# Patient Record
Sex: Female | Born: 1955 | Race: White | Hispanic: No | Smoking: Never smoker
Health system: Southern US, Community
[De-identification: ages and names within clinical notes are randomized; demographics above are authoritative.]

## PROBLEM LIST (undated history)

## (undated) DIAGNOSIS — K08409 Partial loss of teeth, unspecified cause, unspecified class: Secondary | ICD-10-CM

## (undated) DIAGNOSIS — N200 Calculus of kidney: Secondary | ICD-10-CM

## (undated) DIAGNOSIS — I10 Essential (primary) hypertension: Secondary | ICD-10-CM

## (undated) DIAGNOSIS — E785 Hyperlipidemia, unspecified: Secondary | ICD-10-CM

## (undated) DIAGNOSIS — H3090 Unspecified chorioretinal inflammation, unspecified eye: Secondary | ICD-10-CM

## (undated) DIAGNOSIS — F319 Bipolar disorder, unspecified: Secondary | ICD-10-CM

## (undated) DIAGNOSIS — E039 Hypothyroidism, unspecified: Secondary | ICD-10-CM

## (undated) DIAGNOSIS — I499 Cardiac arrhythmia, unspecified: Secondary | ICD-10-CM

## (undated) HISTORY — DX: Essential (primary) hypertension: I10

## (undated) HISTORY — DX: Hyperlipidemia, unspecified: E78.5

## (undated) HISTORY — DX: Unspecified chorioretinal inflammation, unspecified eye: H30.90

## (undated) HISTORY — PX: OTHER SURGICAL HISTORY: SHX169

---

## 1999-09-09 ENCOUNTER — Encounter: Admission: RE | Admit: 1999-09-09 | Discharge: 1999-09-09 | Payer: Self-pay | Admitting: Internal Medicine

## 1999-09-09 ENCOUNTER — Encounter (HOSPITAL_BASED_OUTPATIENT_CLINIC_OR_DEPARTMENT_OTHER): Payer: Self-pay | Admitting: Internal Medicine

## 2000-09-26 ENCOUNTER — Encounter: Payer: Self-pay | Admitting: Obstetrics and Gynecology

## 2000-09-26 ENCOUNTER — Encounter: Admission: RE | Admit: 2000-09-26 | Discharge: 2000-09-26 | Payer: Self-pay | Admitting: Obstetrics and Gynecology

## 2001-10-16 ENCOUNTER — Other Ambulatory Visit: Admission: RE | Admit: 2001-10-16 | Discharge: 2001-10-16 | Payer: Self-pay | Admitting: *Deleted

## 2001-12-13 ENCOUNTER — Encounter: Payer: Self-pay | Admitting: Obstetrics and Gynecology

## 2001-12-13 ENCOUNTER — Encounter: Admission: RE | Admit: 2001-12-13 | Discharge: 2001-12-13 | Payer: Self-pay | Admitting: Obstetrics and Gynecology

## 2002-10-05 ENCOUNTER — Encounter: Payer: Self-pay | Admitting: Internal Medicine

## 2002-10-05 ENCOUNTER — Encounter: Admission: RE | Admit: 2002-10-05 | Discharge: 2002-10-05 | Payer: Self-pay | Admitting: Internal Medicine

## 2002-10-08 ENCOUNTER — Encounter: Admission: RE | Admit: 2002-10-08 | Discharge: 2002-10-08 | Payer: Self-pay | Admitting: Internal Medicine

## 2002-10-08 ENCOUNTER — Encounter: Payer: Self-pay | Admitting: Internal Medicine

## 2002-10-22 ENCOUNTER — Other Ambulatory Visit: Admission: RE | Admit: 2002-10-22 | Discharge: 2002-10-22 | Payer: Self-pay | Admitting: *Deleted

## 2003-04-09 ENCOUNTER — Encounter: Admission: RE | Admit: 2003-04-09 | Discharge: 2003-04-09 | Payer: Self-pay | Admitting: *Deleted

## 2004-01-07 ENCOUNTER — Other Ambulatory Visit: Admission: RE | Admit: 2004-01-07 | Discharge: 2004-01-07 | Payer: Self-pay | Admitting: *Deleted

## 2004-05-08 ENCOUNTER — Emergency Department (HOSPITAL_COMMUNITY): Admission: EM | Admit: 2004-05-08 | Discharge: 2004-05-09 | Payer: Self-pay | Admitting: Emergency Medicine

## 2004-05-13 ENCOUNTER — Encounter: Admission: RE | Admit: 2004-05-13 | Discharge: 2004-05-13 | Payer: Self-pay | Admitting: *Deleted

## 2004-09-26 ENCOUNTER — Emergency Department (HOSPITAL_COMMUNITY): Admission: EM | Admit: 2004-09-26 | Discharge: 2004-09-26 | Payer: Self-pay | Admitting: Emergency Medicine

## 2005-01-14 ENCOUNTER — Emergency Department (HOSPITAL_COMMUNITY): Admission: EM | Admit: 2005-01-14 | Discharge: 2005-01-14 | Payer: Self-pay | Admitting: Emergency Medicine

## 2005-04-29 ENCOUNTER — Encounter: Admission: RE | Admit: 2005-04-29 | Discharge: 2005-04-29 | Payer: Self-pay | Admitting: Internal Medicine

## 2005-06-23 ENCOUNTER — Encounter: Admission: RE | Admit: 2005-06-23 | Discharge: 2005-06-23 | Payer: Self-pay | Admitting: *Deleted

## 2005-07-11 ENCOUNTER — Encounter: Admission: RE | Admit: 2005-07-11 | Discharge: 2005-07-11 | Payer: Self-pay | Admitting: *Deleted

## 2005-10-10 ENCOUNTER — Encounter: Admission: RE | Admit: 2005-10-10 | Discharge: 2005-10-10 | Payer: Self-pay | Admitting: Gastroenterology

## 2006-10-17 ENCOUNTER — Encounter: Admission: RE | Admit: 2006-10-17 | Discharge: 2006-10-17 | Payer: Self-pay | Admitting: *Deleted

## 2007-10-30 ENCOUNTER — Encounter: Admission: RE | Admit: 2007-10-30 | Discharge: 2007-10-30 | Payer: Self-pay | Admitting: Family Medicine

## 2008-10-30 ENCOUNTER — Encounter: Admission: RE | Admit: 2008-10-30 | Discharge: 2008-10-30 | Payer: Self-pay | Admitting: Internal Medicine

## 2009-11-02 ENCOUNTER — Encounter: Admission: RE | Admit: 2009-11-02 | Discharge: 2009-11-02 | Payer: Self-pay | Admitting: Internal Medicine

## 2010-06-22 ENCOUNTER — Encounter: Admission: RE | Admit: 2010-06-22 | Discharge: 2010-06-22 | Payer: Self-pay | Admitting: Internal Medicine

## 2010-11-22 ENCOUNTER — Other Ambulatory Visit: Payer: Self-pay | Admitting: Internal Medicine

## 2010-11-22 DIAGNOSIS — Z1231 Encounter for screening mammogram for malignant neoplasm of breast: Secondary | ICD-10-CM

## 2010-12-14 ENCOUNTER — Ambulatory Visit: Payer: Self-pay

## 2010-12-20 ENCOUNTER — Ambulatory Visit
Admission: RE | Admit: 2010-12-20 | Discharge: 2010-12-20 | Disposition: A | Discharge status: Home / Self Care | Payer: BC Managed Care – PPO | Source: Ambulatory Visit | Attending: Internal Medicine | Admitting: Internal Medicine

## 2010-12-20 DIAGNOSIS — Z1231 Encounter for screening mammogram for malignant neoplasm of breast: Secondary | ICD-10-CM

## 2011-12-30 ENCOUNTER — Other Ambulatory Visit: Payer: Self-pay | Admitting: Internal Medicine

## 2011-12-30 DIAGNOSIS — Z1231 Encounter for screening mammogram for malignant neoplasm of breast: Secondary | ICD-10-CM

## 2012-02-17 ENCOUNTER — Ambulatory Visit: Payer: BC Managed Care – PPO

## 2012-02-23 ENCOUNTER — Other Ambulatory Visit: Payer: Self-pay | Admitting: Obstetrics and Gynecology

## 2012-02-23 ENCOUNTER — Ambulatory Visit: Payer: BC Managed Care – PPO

## 2012-02-23 DIAGNOSIS — M8580 Other specified disorders of bone density and structure, unspecified site: Secondary | ICD-10-CM

## 2012-03-07 ENCOUNTER — Ambulatory Visit
Admission: RE | Admit: 2012-03-07 | Discharge: 2012-03-07 | Disposition: A | Discharge status: Home / Self Care | Payer: BC Managed Care – PPO | Source: Ambulatory Visit | Attending: Obstetrics and Gynecology | Admitting: Obstetrics and Gynecology

## 2012-03-07 ENCOUNTER — Ambulatory Visit
Admission: RE | Admit: 2012-03-07 | Discharge: 2012-03-07 | Disposition: A | Discharge status: Home / Self Care | Payer: BC Managed Care – PPO | Source: Ambulatory Visit | Attending: Internal Medicine | Admitting: Internal Medicine

## 2012-03-07 DIAGNOSIS — M8580 Other specified disorders of bone density and structure, unspecified site: Secondary | ICD-10-CM

## 2012-03-07 DIAGNOSIS — Z1231 Encounter for screening mammogram for malignant neoplasm of breast: Secondary | ICD-10-CM

## 2012-04-11 ENCOUNTER — Ambulatory Visit
Admission: RE | Admit: 2012-04-11 | Discharge: 2012-04-11 | Disposition: A | Discharge status: Home / Self Care | Payer: BC Managed Care – PPO | Source: Ambulatory Visit | Attending: Internal Medicine | Admitting: Internal Medicine

## 2012-04-11 ENCOUNTER — Other Ambulatory Visit: Payer: Self-pay | Admitting: Internal Medicine

## 2012-04-11 DIAGNOSIS — R109 Unspecified abdominal pain: Secondary | ICD-10-CM

## 2012-05-18 ENCOUNTER — Other Ambulatory Visit: Payer: Self-pay | Admitting: Urology

## 2012-05-18 ENCOUNTER — Encounter (HOSPITAL_COMMUNITY): Payer: Self-pay | Admitting: Anesthesiology

## 2012-05-18 ENCOUNTER — Ambulatory Visit (HOSPITAL_COMMUNITY)
Admission: RE | Admit: 2012-05-18 | Discharge: 2012-05-18 | Disposition: A | Discharge status: Home / Self Care | Payer: BC Managed Care – PPO | Source: Ambulatory Visit | Attending: Urology | Admitting: Urology

## 2012-05-18 ENCOUNTER — Ambulatory Visit (HOSPITAL_COMMUNITY): Payer: BC Managed Care – PPO | Admitting: Anesthesiology

## 2012-05-18 ENCOUNTER — Encounter (HOSPITAL_COMMUNITY): Payer: Self-pay | Admitting: *Deleted

## 2012-05-18 ENCOUNTER — Encounter (HOSPITAL_COMMUNITY)
Admission: RE | Disposition: A | Discharge status: Home / Self Care | Payer: Self-pay | Source: Ambulatory Visit | Attending: Urology

## 2012-05-18 DIAGNOSIS — E7800 Pure hypercholesterolemia, unspecified: Secondary | ICD-10-CM | POA: Insufficient documentation

## 2012-05-18 DIAGNOSIS — E039 Hypothyroidism, unspecified: Secondary | ICD-10-CM | POA: Insufficient documentation

## 2012-05-18 DIAGNOSIS — N201 Calculus of ureter: Secondary | ICD-10-CM | POA: Insufficient documentation

## 2012-05-18 DIAGNOSIS — Z79899 Other long term (current) drug therapy: Secondary | ICD-10-CM | POA: Insufficient documentation

## 2012-05-18 DIAGNOSIS — N1330 Unspecified hydronephrosis: Secondary | ICD-10-CM | POA: Insufficient documentation

## 2012-05-18 DIAGNOSIS — M899 Disorder of bone, unspecified: Secondary | ICD-10-CM | POA: Insufficient documentation

## 2012-05-18 HISTORY — DX: Partial loss of teeth, unspecified cause, unspecified class: K08.409

## 2012-05-18 HISTORY — DX: Calculus of kidney: N20.0

## 2012-05-18 HISTORY — DX: Hypothyroidism, unspecified: E03.9

## 2012-05-18 HISTORY — PX: CYSTOSCOPY/RETROGRADE/URETEROSCOPY/STONE EXTRACTION WITH BASKET: SHX5317

## 2012-05-18 HISTORY — DX: Bipolar disorder, unspecified: F31.9

## 2012-05-18 HISTORY — PX: CYSTOSCOPY W/ URETERAL STENT PLACEMENT: SHX1429

## 2012-05-18 HISTORY — DX: Cardiac arrhythmia, unspecified: I49.9

## 2012-05-18 SURGERY — CYSTOSCOPY, WITH RETROGRADE PYELOGRAM AND URETERAL STENT INSERTION
Anesthesia: General | Site: Ureter | Laterality: Left | Wound class: Clean Contaminated

## 2012-05-18 MED ORDER — ACETAMINOPHEN 10 MG/ML IV SOLN
INTRAVENOUS | Status: AC
  Filled 2012-05-18: qty 100

## 2012-05-18 MED ORDER — LACTATED RINGERS IV SOLN
INTRAVENOUS | Status: DC | PRN
  Administered 2012-05-18: 18:00:00 via INTRAVENOUS

## 2012-05-18 MED ORDER — MUPIROCIN 2 % EX OINT: TOPICAL_OINTMENT | Freq: Two times a day (BID) | CUTANEOUS | Status: DC

## 2012-05-18 MED ORDER — CEFAZOLIN SODIUM-DEXTROSE 2-3 GM-% IV SOLR
INTRAVENOUS | Status: AC
  Filled 2012-05-18: qty 50

## 2012-05-18 MED ORDER — OXYCODONE HCL 5 MG/5ML PO SOLN
5.0000 mg | Freq: Once | ORAL | Status: DC | PRN
  Filled 2012-05-18: qty 5

## 2012-05-18 MED ORDER — BELLADONNA ALKALOIDS-OPIUM 16.2-60 MG RE SUPP
RECTAL | Status: AC
  Filled 2012-05-18: qty 1

## 2012-05-18 MED ORDER — CEFAZOLIN SODIUM 1-5 GM-% IV SOLN
1.0000 g | INTRAVENOUS | Status: AC
  Administered 2012-05-18: 2 g via INTRAVENOUS

## 2012-05-18 MED ORDER — MIDAZOLAM HCL 5 MG/5ML IJ SOLN
INTRAMUSCULAR | Status: DC | PRN
  Administered 2012-05-18: 2 mg via INTRAVENOUS

## 2012-05-18 MED ORDER — INDIGOTINDISULFONATE SODIUM 8 MG/ML IJ SOLN
INTRAMUSCULAR | Status: AC
  Filled 2012-05-18: qty 5

## 2012-05-18 MED ORDER — PROPOFOL 10 MG/ML IV BOLUS
INTRAVENOUS | Status: DC | PRN
  Administered 2012-05-18: 200 mg via INTRAVENOUS

## 2012-05-18 MED ORDER — LIDOCAINE HCL (CARDIAC) 20 MG/ML IV SOLN
INTRAVENOUS | Status: DC | PRN
  Administered 2012-05-18: 50 mg via INTRAVENOUS

## 2012-05-18 MED ORDER — HYDROMORPHONE HCL PF 1 MG/ML IJ SOLN: 0.2500 mg | INTRAMUSCULAR | Status: DC | PRN

## 2012-05-18 MED ORDER — PROMETHAZINE HCL 25 MG/ML IJ SOLN: 6.2500 mg | INTRAMUSCULAR | Status: DC | PRN

## 2012-05-18 MED ORDER — ONDANSETRON HCL 4 MG/2ML IJ SOLN
INTRAMUSCULAR | Status: DC | PRN
  Administered 2012-05-18: 4 mg via INTRAVENOUS

## 2012-05-18 MED ORDER — MUPIROCIN 2 % EX OINT
TOPICAL_OINTMENT | CUTANEOUS | Status: AC
  Filled 2012-05-18: qty 22

## 2012-05-18 MED ORDER — ACETAMINOPHEN 10 MG/ML IV SOLN
INTRAVENOUS | Status: DC | PRN
  Administered 2012-05-18: 1000 mg via INTRAVENOUS

## 2012-05-18 MED ORDER — FENTANYL CITRATE 0.05 MG/ML IJ SOLN
INTRAMUSCULAR | Status: DC | PRN
  Administered 2012-05-18 (×2): 50 ug via INTRAVENOUS

## 2012-05-18 MED ORDER — IOHEXOL 300 MG/ML  SOLN
INTRAMUSCULAR | Status: DC | PRN
  Administered 2012-05-18: 2 mL via INTRAVENOUS

## 2012-05-18 MED ORDER — IOHEXOL 300 MG/ML  SOLN
INTRAMUSCULAR | Status: AC
  Filled 2012-05-18: qty 1

## 2012-05-18 MED ORDER — MEPERIDINE HCL 50 MG/ML IJ SOLN: 6.2500 mg | INTRAMUSCULAR | Status: DC | PRN

## 2012-05-18 MED ORDER — OXYCODONE HCL 5 MG PO TABS: 5.0000 mg | ORAL_TABLET | Freq: Once | ORAL | Status: DC | PRN

## 2012-05-18 MED ORDER — LACTATED RINGERS IV SOLN
INTRAVENOUS | Status: DC
  Administered 2012-05-18: 19:00:00 via INTRAVENOUS

## 2012-05-18 MED ORDER — LIDOCAINE HCL 2 % EX GEL
CUTANEOUS | Status: AC
  Filled 2012-05-18: qty 10

## 2012-05-18 SURGICAL SUPPLY — 22 items
ADAPTER CATH URET PLST 4-6FR (CATHETERS) ×3 IMPLANT
ADPR CATH URET STRL DISP 4-6FR (CATHETERS) ×2
BAG URO CATCHER STRL LF (DRAPE) ×3 IMPLANT
BASKET STONE NITINOL 3FR/115 (UROLOGICAL SUPPLIES) ×2 IMPLANT
CATH INTERMIT  6FR 70CM (CATHETERS) IMPLANT
CATH URET 5FR 28IN CONE TIP (BALLOONS) ×1
CATH URET 5FR 28IN OPEN ENDED (CATHETERS) ×3 IMPLANT
CATH URET 5FR 70CM CONE TIP (BALLOONS) ×1 IMPLANT
CLOTH BEACON ORANGE TIMEOUT ST (SAFETY) ×3 IMPLANT
DRAPE CAMERA CLOSED 9X96 (DRAPES) ×3 IMPLANT
GLOVE BIOGEL M 7.0 STRL (GLOVE) ×3 IMPLANT
GLOVE SURG SS PI 8.0 STRL IVOR (GLOVE) ×3 IMPLANT
GOWN PREVENTION PLUS XLARGE (GOWN DISPOSABLE) ×3 IMPLANT
GOWN STRL NON-REIN LRG LVL3 (GOWN DISPOSABLE) ×6 IMPLANT
GOWN STRL REIN XL XLG (GOWN DISPOSABLE) ×3 IMPLANT
MANIFOLD NEPTUNE II (INSTRUMENTS) ×3 IMPLANT
MARKER SKIN DUAL TIP RULER LAB (MISCELLANEOUS) ×3 IMPLANT
NS IRRIG 1000ML POUR BTL (IV SOLUTION) ×3 IMPLANT
PACK CYSTO (CUSTOM PROCEDURE TRAY) ×3 IMPLANT
STENT CONTOUR 6FRX24X.038 (STENTS) ×4 IMPLANT
TUBING CONNECTING 10 (TUBING) ×3 IMPLANT
WIRE COONS/BENSON .038X145CM (WIRE) ×3 IMPLANT

## 2012-05-18 NOTE — Anesthesia Preprocedure Evaluation (Addendum)
Anesthesia Evaluation  Patient identified by MRN, date of birth, ID band Patient awake    Reviewed: Allergy & Precautions, H&P , NPO status , Patient's Chart, lab work & pertinent test results  History of Anesthesia Complications (+) AWARENESS UNDER ANESTHESIA  Airway Mallampati: II TM Distance: >3 FB Neck ROM: Full    Dental  (+) Teeth Intact and Dental Advisory Given   Pulmonary neg pulmonary ROS,  breath sounds clear to auscultation        Cardiovascular Exercise Tolerance: Good - CAD negative cardio ROS  Rhythm:Regular Rate:Normal     Neuro/Psych PSYCHIATRIC DISORDERS Bipolar Disorder negative neurological ROS     GI/Hepatic negative GI ROS, Neg liver ROS,   Endo/Other  Hypothyroidism   Renal/GU Renal diseasenegative Renal ROS     Musculoskeletal negative musculoskeletal ROS (+)   Abdominal   Peds  Hematology negative hematology ROS (+)   Anesthesia Other Findings   Reproductive/Obstetrics                        Anesthesia Physical Anesthesia Plan  ASA: II and Emergent  Anesthesia Plan: General   Post-op Pain Management:    Induction: Intravenous  Airway Management Planned: LMA  Additional Equipment:   Intra-op Plan:   Post-operative Plan: Extubation in OR  Informed Consent: I have reviewed the patients History and Physical, chart, labs and discussed the procedure including the risks, benefits and alternatives for the proposed anesthesia with the patient or authorized representative who has indicated his/her understanding and acceptance.   Dental advisory given  Plan Discussed with: CRNA and Surgeon  Anesthesia Plan Comments:        Anesthesia Quick Evaluation

## 2012-05-18 NOTE — H&P (Signed)
Reason For Visit     Seen today as a work-in for complaint of acute on-set of abdominal pain w/N/V X 16+ hrs.   Active Problems Problems  1. Abdominal Pain 789.00 2. Hydronephrosis On The Left 591 3. Nausea With Vomiting 787.01 4. Ureteral Stone 592.1  History of Present Illness        56 YO female patient of Dr. Estil Daft seen today as a work-in for complaint of acute on-set of abdominal pain w/N/V X 16+hrs.  GU HX:  Last seen 05/04/12. Left ureteral stone. She developed acute onset of left flank pain. She was given what sounds like Toradol. This is her first stone. She has nausea and no emesis. No fever or chills. She has had no dysuria. She has not seen the stone pass.   Patient underwent CT scan of the abdomen and pelvis today and reviewed all the images. This showed a 3-4 mm left ureterovesical junction stone with mild proximal hydroureteronephrosis. There were no other stones.      She was seen by Dr. Brunilda Payor November 2012 at Dr Awilda Metro Lavoie's request for microhematuria x 2 at Dr Sharol Roussel office.  She denies frequency, urgency, gross hematuria or dysuria.  She has mild left lower quadrant discomfort on and off. NMP22 was normal. She had a negative hematuria work-up 20 years ago by Dr Aldean Ast.   KUB 04/11/12 -- there are multiple phleboliths. Compared the KUB with the CT from today. There are 4 phleboliths in the left pelvis in 5 calcifications on the KUB. One of these is likely the UVJ stone. The stone is small and should pass. There was a small phlebolith in the right pelvis. There were no other stones in the ureters. There were no stones around the kidneys. The bowel gas pattern was normal. The bones appeared normal.  Interval HX:  Denies passing stone material. Has not been able to keep pain controlled w/hydrocodone or N/V w/promethazine. Has had dry toast 6 hrs ago but has had vomiting since. No fever/chills.   Past Medical History Problems  1. History of  Anxiety (Symptom)  300.00 2. History of  Depression 311 3. History of  Hypercholesterolemia 272.0 4. History of  Hypothyroidism 244.9 5. History of  Menopause V49.81 6. History of  Osteopenia 733.90  Surgical History Problems  1. History of  Oral Surgery Tooth Extraction  Current Meds 1. Estradiol-Norethindrone Acet 1-0.5 MG Oral Tablet; Therapy: (Recorded:09Nov2012) to 2. Hydrocodone-Acetaminophen 5-325 MG Oral Tablet; TAKE 1 TO 2 TABLETS EVERY 4 TO 6  HOURS AS NEEDED FOR PAIN; Therapy: 07Aug2013 to (Evaluate:06Sep2013); Last  Rx:07Aug2013 3. LaMICtal XR 200 MG Oral Tablet Extended Release 24 Hour; Therapy: (Recorded:12Nov2012) to 4. Lipitor 40 MG Oral Tablet; Therapy: (Recorded:09Nov2012) to 5. Promethazine HCl 12.5 MG Oral Tablet; TAKE 1 TABLET EVERY 4 TO 6 HOURS AS NEEDED  FOR NAUSEA; Therapy: 07Aug2013 to (Evaluate:21Aug2013)  Requested for: 07Aug2013; Last  Rx:07Aug2013 6. SEROquel TABS; Therapy: (Recorded:09Nov2012) to 7. Synthroid 150 MCG Oral Tablet; Therapy: (Recorded:12Nov2012) to 8. Tamsulosin HCl 0.4 MG Oral Capsule; TAKE 1 CAPSULE Daily; Therapy: 07Aug2013 to  (Evaluate:06Sep2013)  Requested for: 07Aug2013; Last Rx:07Aug2013  Allergies Medication  1. Novocain SOLN  Family History Problems  1. Sororal history of  Blood In Urine 2. Maternal grandmother's history of  Diabetes Mellitus V18.0 3. Paternal history of  Family Health Status - Father's Age 24yrs 4. Maternal history of  Family Health Status - Mother's Age 26yrs 5. Maternal grandmother's history of  Osteoporosis V17.81 6. Sororal history  of  Osteoporosis V17.81 Denied  7. Family history of  Family Health Status Number Of Children  Social History Problems  1. Being A Social Drinker 2. Caffeine Use 2 qd 3. Marital History - Currently Married 4. Never A Smoker 5. Occupation: Advertising account executive Denied  6. History of  Alcohol Use  Review of Systems Genitourinary, constitutional, skin, eye, otolaryngeal,  hematologic/lymphatic, cardiovascular, pulmonary, endocrine, musculoskeletal, gastrointestinal, neurological and psychiatric system(s) were reviewed and pertinent findings if present are noted.  Gastrointestinal: nausea, vomiting and abdominal pain.    Vitals Vital Signs [Data Includes: Last 1 Day]  13Sep2013 01:48PM  BMI Calculated: 27.98 BSA Calculated: 1.85 Height: 5 ft 5.5 in Weight: 170 lb  Blood Pressure: 158 / 89 Temperature: 98.3 F Heart Rate: 94  Physical Exam Constitutional: Well nourished and well developed . In acute distress. The patient appears well hydrated.  ENT:. The ears and nose are normal in appearance.  Neck: The appearance of the neck is normal.  Pulmonary: No respiratory distress.  Cardiovascular: Heart rate and rhythm are normal.  Abdomen: The abdomen is flat. The abdomen is soft and nontender. Moderate tenderness in the LLQ is present. moderate left CVA tenderness.  Skin: Normal skin turgor and normal skin color and pigmentation.  Neuro/Psych:. Mood and affect are appropriate.    Results/Data Urine [Data Includes: Last 1 Day]   13Sep2013  COLOR YELLOW   APPEARANCE CLEAR   SPECIFIC GRAVITY >1.030   pH 5.5   GLUCOSE NEG mg/dL  BILIRUBIN NEG   KETONE NEG mg/dL  BLOOD LARGE   PROTEIN TRACE mg/dL  UROBILINOGEN 0.2 mg/dL  NITRITE NEG   LEUKOCYTE ESTERASE NEG   SQUAMOUS EPITHELIAL/HPF RARE   WBC 0-2 WBC/hpf  RBC 3-6 RBC/hpf  BACTERIA NONE SEEN   CRYSTALS NONE SEEN   CASTS NONE SEEN    The following images/tracing/specimen were independently visualized:  CTU: moderate/severe left hydonephrosis and hydroureter secondary to distal left 4 mm ueteral calculus.  The following clinical lab reports were reviewed:  UA. Selected Results  AU CT-STONE PROTOCOL 13Sep2013 12:00AM Jetta Lout   Test Name Result Flag Reference  ** RADIOLOGY REPORT BY [facility] **   *RADIOLOGY REPORT*  Clinical Data: The left flank and lower quadrant pain.  Micro hematuria. Urolithiasis.  CT ABDOMEN AND PELVIS WITHOUT CONTRAST (URINARY CALCULUS PROTOCOL)  Technique: Multidetector CT imaging was performed through the abdomen and pelvis without intravenous contrast to include the urinary tract.  Comparison: None.  Findings: Moderate left hydronephrosis and ureterectasis is seen. A 2 mm calculus is seen at the left ureterovesicle junction. There is mild left renal swelling and perinephric stranding. No intrarenal calculi are identified. No evidence of right-sided ureteral calculi or hydronephrosis. Pelvic phleboliths noted bilaterally.  The other abdominal parenchymal organs are normal in appearance. Gallbladder is unremarkable. No soft tissue masses or lymphadenopathy identified. Uterus and adnexa are unremarkable. No evidence of inflammatory process or abnormal fluid collections.  IMPRESSION:  2 mm distal left ureteral calculus at the ureterovesicle junction, causing moderate left hydronephrosis and perinephric stranding.   Original Report Authenticated By: Danae Orleans, M.D.   Assessment Assessed  1. Abdominal Pain 789.00 2. Nausea With Vomiting 787.01 3. Ureteral Stone 592.1 4. Hydronephrosis On The Left 591      Ketorolac 60 mg IM.  Morphine 8mg /Promethazine 25 mg IM  Will schedule pt for cystoureterscopy, L RPG, stone extraction, and possible double J stent placement today by Dr.Viktoriya Glaspy. Risks and benefits discussed with both pt and mother  which include: bleeding, infection, and possible ureteral injury. All questions addressed and answered to pt's satisfication and wishes to proceed.   Plan  Ureteral Stone (592.1)  1. Ketorolac Tromethamine 60 MG/2ML Injection Solution; INJECT 60  MG Intramuscular; Done:  13Sep2013 02:26PM; Status: COMPLETE 2. Morphine Sulfate 8 MG/ML Injection Solution; INJECT 8  MG Intramuscular; Done: 13Sep2013  02:29PM; Status: COMPLETE 3. Promethazine HCl 25 MG/ML Injection Solution; INJECT 25 MG  Intramuscular; Done: 13Sep2013  02:28PM; Status: COMPLETE    Scheduled cystoureterscopy, L RPG, stone extraction, and possible double J stent placement by Dr. Brunilda Payor  AU CT-STONE PROTOCOL  Status: Hold For - Appointment,Date of Service,PreCert,Print  Requested for: 13Sep2013 Ordered; For: Ureteral Stone (592.1); Ordered By: Jetta Lout  Due: 15Sep2013 Marked Important   Signatures Electronically signed by : Jetta Lout, Dyann Ruddle; May 18 2012  4:32PM

## 2012-05-18 NOTE — Anesthesia Postprocedure Evaluation (Signed)
Anesthesia Post Note  Patient: Miranda Rollins  Procedure(s) Performed: Procedure(s) (LRB): CYSTOSCOPY WITH RETROGRADE PYELOGRAM/URETERAL STENT PLACEMENT (Left) CYSTOSCOPY/RETROGRADE/URETEROSCOPY/STONE EXTRACTION WITH BASKET (Left)  Anesthesia type: General  Patient location: PACU  Post pain: Pain level controlled  Post assessment: Post-op Vital signs reviewed  Last Vitals: BP 135/79  Pulse 93  Temp 36.9 C (Oral)  Resp 19  Ht 5' 5.5" (1.664 m)  Wt 167 lb 2 oz (75.807 kg)  BMI 27.39 kg/m2  SpO2 100%  Post vital signs: Reviewed  Level of consciousness: sedated  Complications: No apparent anesthesia complications

## 2012-05-18 NOTE — Preoperative (Signed)
Beta Blockers   Reason not to administer Beta Blockers:Not Applicable 

## 2012-05-18 NOTE — Op Note (Signed)
Miranda Rollins is a 56 y.o.   05/18/2012  General  Pre-op diagnosis: Left distal ureteral calculus, left hydronephrosis  Postop diagnosis: Same  Procedure done: Cystoscopy, left retrograde pyelogram, ureteroscopy with stone extraction and insertion of double-J stent  Surgeon: Wendie Simmer. Keiaira Donlan  Anesthesia: General  Indication: Patient is a 56 years old female who has been having left flank pain on and off for the past 4-6 weeks. She is known to have a 4 mm distal ureteral calculus. She was treated with medical expulsive therapy. However she has not passed the stone. Early this morning she started having severe left flank pain associated with nausea and vomiting. A repeat CT scan showed the stone in the left  distal ureter with moderate proximal hydronephrosis. She was treated with analgesics  and was advised to have the stone removed. The procedure , risks , benefits were discussed with the patient. The risks include but are not limited to hemorrhage, infection, ureteral injury, inability to remove the stone. She understands and is agreeable.  Procedure: Patient was identified by her wrist band and proper timeout was taken.  Under general anesthesia she was prepped and draped and placed in the dorsolithotomy position. A panendoscope was inserted in the bladder. The bladder mucosa is normal. There is no stone or tumor in the bladder. The ureteral orifices are in normal position and shape.  Retrograde pyelogram:  A cone-tip catheter was passed through the cystoscope and the left ureteral orifice. 2 cc of Omnipaque were then injected through the cone-tip catheter. The distal ureter appears moderately dilated and there is a filling defect in the distal ureter consistent with the known stone. I did not inject anymore contrast in the ureter to evaluate the mid and proximal ureter. The cone-tip catheter was then removed.  A sensor wire was passed through the cystoscope and the left ureter. The cystoscope  was removed.  A semirigid ureteroscope was then passed in the bladder and through the left ureteral orifice. The stone had migrated in the mid ureter but I was able to pass the ureteroscope without difficulty up to the level of the stone. The stone was then grasped with a Nitinol basket and extracted. The ureteroscope was then reinserted in the ureter and there is no evidence of any other stone in the distal mid or proximal ureter. The ureteroscope was then removed. The sensor wire was then backloaded into the cystoscope and a #[id number] [id number] double-J stent was passed over the sensor wire. The proximal curl of the double-J stent is in the collecting system the distal curl is in the bladder. The string was left attached to the double-J stent for easy removal.  The bladder was then emptied and the cystoscope and guidewire removed.  The patient tolerated the procedure well and left the OR in satisfactory condition to postanesthesia care unit.

## 2012-05-18 NOTE — Transfer of Care (Signed)
Immediate Anesthesia Transfer of Care Note  Patient: Miranda Rollins  Procedure(s) Performed: Procedure(s) (LRB) with comments: CYSTOSCOPY WITH RETROGRADE PYELOGRAM/URETERAL STENT PLACEMENT (Left) CYSTOSCOPY/RETROGRADE/URETEROSCOPY/STONE EXTRACTION WITH BASKET (Left)  Patient Location: PACU  Anesthesia Type: General  Level of Consciousness: awake, alert , oriented and patient cooperative  Airway & Oxygen Therapy: Patient Spontanous Breathing and Patient connected to face mask oxygen  Post-op Assessment: Report given to PACU RN, Post -op Vital signs reviewed and stable and Patient moving all extremities  Post vital signs: Reviewed and stable  Complications: No apparent anesthesia complications

## 2012-05-21 ENCOUNTER — Encounter (HOSPITAL_COMMUNITY): Payer: Self-pay | Admitting: Urology

## 2013-02-04 ENCOUNTER — Other Ambulatory Visit: Payer: Self-pay

## 2013-02-04 DIAGNOSIS — Z1231 Encounter for screening mammogram for malignant neoplasm of breast: Secondary | ICD-10-CM

## 2013-04-03 ENCOUNTER — Ambulatory Visit
Admission: RE | Admit: 2013-04-03 | Discharge: 2013-04-03 | Disposition: A | Discharge status: Home / Self Care | Payer: BC Managed Care – PPO | Source: Ambulatory Visit

## 2013-04-03 DIAGNOSIS — Z1231 Encounter for screening mammogram for malignant neoplasm of breast: Secondary | ICD-10-CM

## 2014-01-15 ENCOUNTER — Emergency Department (HOSPITAL_COMMUNITY): Payer: 59

## 2014-01-15 ENCOUNTER — Emergency Department (HOSPITAL_COMMUNITY)
Admission: EM | Admit: 2014-01-15 | Discharge: 2014-01-15 | Disposition: A | Discharge status: Home / Self Care | Payer: 59 | Attending: Emergency Medicine | Admitting: Emergency Medicine

## 2014-01-15 ENCOUNTER — Encounter (HOSPITAL_COMMUNITY): Payer: Self-pay | Admitting: Emergency Medicine

## 2014-01-15 DIAGNOSIS — E039 Hypothyroidism, unspecified: Secondary | ICD-10-CM | POA: Insufficient documentation

## 2014-01-15 DIAGNOSIS — Z87442 Personal history of urinary calculi: Secondary | ICD-10-CM | POA: Insufficient documentation

## 2014-01-15 DIAGNOSIS — R42 Dizziness and giddiness: Secondary | ICD-10-CM | POA: Insufficient documentation

## 2014-01-15 DIAGNOSIS — Z8619 Personal history of other infectious and parasitic diseases: Secondary | ICD-10-CM | POA: Insufficient documentation

## 2014-01-15 DIAGNOSIS — F411 Generalized anxiety disorder: Secondary | ICD-10-CM | POA: Insufficient documentation

## 2014-01-15 DIAGNOSIS — F319 Bipolar disorder, unspecified: Secondary | ICD-10-CM | POA: Insufficient documentation

## 2014-01-15 DIAGNOSIS — Z79899 Other long term (current) drug therapy: Secondary | ICD-10-CM | POA: Insufficient documentation

## 2014-01-15 DIAGNOSIS — K08109 Complete loss of teeth, unspecified cause, unspecified class: Secondary | ICD-10-CM | POA: Insufficient documentation

## 2014-01-15 DIAGNOSIS — R9389 Abnormal findings on diagnostic imaging of other specified body structures: Secondary | ICD-10-CM

## 2014-01-15 DIAGNOSIS — Z7982 Long term (current) use of aspirin: Secondary | ICD-10-CM | POA: Insufficient documentation

## 2014-01-15 LAB — COMPREHENSIVE METABOLIC PANEL
ALBUMIN: 4.4 g/dL (ref 3.5–5.2)
ALT: 24 U/L (ref 0–35)
AST: 32 U/L (ref 0–37)
Alkaline Phosphatase: 92 U/L (ref 39–117)
BUN: 11 mg/dL (ref 6–23)
CALCIUM: 9.9 mg/dL (ref 8.4–10.5)
CO2: 24 meq/L (ref 19–32)
CREATININE: 0.72 mg/dL (ref 0.50–1.10)
Chloride: 99 mEq/L (ref 96–112)
GFR calc Af Amer: >90 mL/min (ref >90)
Glucose, Bld: 127 mg/dL — ABNORMAL HIGH (ref 70–99)
Potassium: 3.7 mEq/L (ref 3.7–5.3)
Sodium: 139 mEq/L (ref 137–147)
Total Bilirubin: 0.9 mg/dL (ref 0.3–1.2)
Total Protein: 8.1 g/dL (ref 6.0–8.3)

## 2014-01-15 LAB — CBC WITH DIFFERENTIAL/PLATELET
BASOS ABS: 0 10*3/uL (ref 0.0–0.1)
BASOS PCT: 0 % (ref 0–1)
EOS PCT: 1 % (ref 0–5)
Eosinophils Absolute: 0.1 10*3/uL (ref 0.0–0.7)
HEMATOCRIT: 37.3 % (ref 36.0–46.0)
Hemoglobin: 12.6 g/dL (ref 12.0–15.0)
Lymphocytes Relative: 28 % (ref 12–46)
Lymphs Abs: 2.2 10*3/uL (ref 0.7–4.0)
MCH: 31.2 pg (ref 26.0–34.0)
MCHC: 33.8 g/dL (ref 30.0–36.0)
MCV: 92.3 fL (ref 78.0–100.0)
MONO ABS: 0.5 10*3/uL (ref 0.1–1.0)
MONOS PCT: 6 % (ref 3–12)
Neutro Abs: 4.9 10*3/uL (ref 1.7–7.7)
Neutrophils Relative %: 65 % (ref 43–77)
Platelets: 234 10*3/uL (ref 150–400)
RBC: 4.04 MIL/uL (ref 3.87–5.11)
RDW: 12.9 % (ref 11.5–15.5)
WBC: 7.7 10*3/uL (ref 4.0–10.5)

## 2014-01-15 MED ORDER — MECLIZINE HCL 50 MG PO TABS: 50.0000 mg | ORAL_TABLET | Freq: Three times a day (TID) | ORAL | Status: DC | PRN

## 2014-01-15 NOTE — ED Notes (Signed)
Patient transported to Ultrasound 

## 2014-01-15 NOTE — ED Provider Notes (Signed)
Patient's MRI results reviewed with her. She will be given prescription for Antivert and referral to neurology  Toy BakerAnthony T Morrell Fluke, MD 01/15/14 1751

## 2014-01-15 NOTE — ED Notes (Addendum)
Pt reports 11:30pm was feeling fine; took cough syrup for cough starting yesterday and woke up with blurry vision. Pt felt dizzy as well so ate crackers and took shower. Laid down and felt better but MD said she should come here. Current symptoms: dry mouth, tingly, blurred vision, light sensitivity; no dizzness. Pt concerned cough syrup interacted with mental health meds.

## 2014-01-15 NOTE — ED Provider Notes (Signed)
CSN: [id number]     Arrival date & time 01/15/14  1056 History   First MD Initiated Contact with Patient 01/15/14 1122     Chief Complaint  Patient presents with  . Blurred Vision      HPI  She presents after an episode of dizziness and blurry vision this morning. She woke up, and sat up. She thought things were spinning around. She states her vision was blurry. She admits that she got very anxious. 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  Had a dilated exam there was told was normal. By the time she got to her ophthalmologist office her symptoms had resolved. She states it all together they lasted less than 10 or 15 minutes. She had nausea. She had a spinning sensation. She did not have headache. Her vision is normal she is currently asymptomatic. She was sent over from her retina specialist for evaluation for possible TIA/posterior circulation stroke.  She has a history of hypercholesterolemia. No other wrist or cardiovascular disease. No family history cardiovascular disease. Nonsmoker. She has hypertension that she control of exercise. Nondiabetic.  Past Medical History  Diagnosis Date  . Kidney stone   . Irregular heart beat   . Hypothyroidism   . Bipolar 1 disorder   . Wisdom teeth extracted    Past Surgical History  Procedure Laterality Date  . Cystoscopy w/ ureteral stent placement  05/18/2012    Procedure: CYSTOSCOPY WITH RETROGRADE PYELOGRAM/URETERAL STENT PLACEMENT;  Surgeon: Lindaann SloughMarc-Henry Nesi, MD;  Location: [facility];  Service: Urology;  Laterality: Left;  . Cystoscopy/retrograde/ureteroscopy/stone extraction with basket  05/18/2012    Procedure: CYSTOSCOPY/RETROGRADE/URETEROSCOPY/STONE EXTRACTION WITH BASKET;  Surgeon: Lindaann SloughMarc-Henry Nesi, MD;  Location: [facility];  Service: Urology;  Laterality: Left;   History reviewed. No pertinent family history. History  Substance Use Topics  . Smoking status: Never Smoker    . Smokeless tobacco: Not on file  . Alcohol Use: Yes   OB History   Grav Para Term Preterm Abortions TAB SAB Ect Mult Living                 Review of Systems  Constitutional: Negative for fever, chills, diaphoresis, appetite change and fatigue.  HENT: Negative for mouth sores, sore throat and trouble swallowing.   Eyes: Positive for visual disturbance.  Respiratory: Negative for cough, chest tightness, shortness of breath and wheezing.   Cardiovascular: Negative for chest pain.  Gastrointestinal: Negative for nausea, vomiting, abdominal pain, diarrhea and abdominal distention.  Endocrine: Negative for polydipsia, polyphagia and polyuria.  Genitourinary: Negative for dysuria, frequency and hematuria.  Musculoskeletal: Negative for gait problem.  Skin: Negative for color change, pallor and rash.  Neurological: Positive for dizziness. Negative for syncope, light-headedness and headaches.  Hematological: Does not bruise/bleed easily.  Psychiatric/Behavioral: Negative for behavioral problems and confusion.      Allergies  Novocain  Home Medications   Prior to Admission medications   Medication Sig Start Date End Date Taking? Authorizing Provider  aspirin EC 81 MG tablet Take 81 mg by mouth daily.   Yes Historical Provider, MD  atorvastatin (LIPITOR) 80 MG tablet Take 80 mg by mouth daily.   Yes Historical Provider, MD  estradiol (ESTRACE) 0.1 MG/GM vaginal cream Place 1 Applicatorful vaginally daily as needed (for dryness).   Yes Historical Provider, MD  Flaxseed, Linseed, (FLAX SEEDS PO) Take 1 tablet by mouth daily.   Yes Historical Provider, MD  IRON PO Take 1  tablet by mouth daily.   Yes Historical Provider, MD  lamoTRIgine (LAMICTAL) 200 MG tablet Take 200 mg by mouth daily.   Yes Historical Provider, MD  levothyroxine (SYNTHROID, LEVOTHROID) 150 MCG tablet Take 150 mcg by mouth daily.   Yes Historical Provider, MD  Omega-3 Fatty Acids (FISH OIL PO) Take 1 capsule by mouth  daily.   Yes Historical Provider, MD  QUEtiapine (SEROQUEL XR) 200 MG 24 hr tablet Take 200 mg by mouth at bedtime.   Yes Historical Provider, MD   BP 144/82  Pulse 96  Temp(Src) 99 F (37.2 C) (Oral)  Resp 18  Ht 5\' 5"  (1.651 m)  Wt 171 lb (77.565 kg)  BMI 28.46 kg/m2  SpO2 94% Physical Exam  Constitutional: She is oriented to person, place, and time. She appears well-developed and well-nourished. No distress.  HENT:  Head: Normocephalic.  Eyes: Conjunctivae are normal. Pupils are equal, round, and reactive to light. No scleral icterus.  Pupils dilated almost the full width of her iris. Consistent with a recent dilated exam her ophthalmologist. No retinal abnormalities noted.  Neck: Normal range of motion. Neck supple. No thyromegaly present.  Cardiovascular: Normal rate and regular rhythm.  Exam reveals no gallop and no friction rub.   No murmur heard. Pulmonary/Chest: Effort normal and breath sounds normal. No respiratory distress. She has no wheezes. She has no rales.  Abdominal: Soft. Bowel sounds are normal. She exhibits no distension. There is no tenderness. There is no rebound.  Musculoskeletal: Normal range of motion.  Neurological: She is alert and oriented to person, place, and time.  Cranial nerve exam without abnormalities. No nystagmus. No subjective diplopia or blurring of vision. No pronator drift.  Skin: Skin is warm and dry. No rash noted.  Psychiatric: She has a normal mood and affect. Her behavior is normal.    ED Course  Procedures (including critical care time) Labs Review Labs Reviewed  COMPREHENSIVE METABOLIC PANEL - Abnormal; Notable for the following:    Glucose, Bld 127 (*)    All other components within normal limits  CBC WITH DIFFERENTIAL    Imaging Review Mr Brain Wo Contrast  01/15/2014   CLINICAL DATA:  Episode of blurred vision with vertigo and nausea. Sensitivity to light.  EXAM: MRI HEAD WITHOUT CONTRAST  TECHNIQUE: Multiplanar, multiecho  pulse sequences of the brain and surrounding structures were obtained without intravenous contrast.  COMPARISON:  None.  FINDINGS: The diffusion-weighted images demonstrate no evidence for acute or subacute infarction. Scattered subcortical T2 hyperintensities are at the upper limits of normal for age.  Flow is present in the major intracranial arteries. The globes and orbits are intact. The paranasal sinuses and mastoid air cells are clear.  IMPRESSION: 1. Scattered subcortical T2 hyperintensities are at the upper limits of normal for age. The finding is nonspecific but can be seen in the setting of chronic microvascular ischemia, a demyelinating process such as multiple sclerosis, vasculitis, complicated migraine headaches, or as the sequelae of a prior infectious or inflammatory process. 2. No acute intracranial abnormality or focal lesion to explain the patient's symptoms.   Electronically Signed   By: Gennette Pac M.D.   On: 01/15/2014 17:22     EKG Interpretation None      MDM   Final diagnoses:  Vertigo    MRI ordered. I think her symptoms are most consistent with acute peripheral vertigo remains asymptomatic here. If her MRI is normal I think she is appropriate for outpatient treatment.  Rolland PorterMark Breckyn Ticas, MD 01/16/14 [phone]

## 2014-01-15 NOTE — Discharge Instructions (Signed)
Your MRI today had some abnormal findings that require you to be seen by a neurologist. Please call them tomorrow to schedule a follow up visit Benign Positional Vertigo Vertigo means you feel like you or your surroundings are moving when they are not. Benign positional vertigo is the most common form of vertigo. Benign means that the cause of your condition is not serious. Benign positional vertigo is more common in older adults. CAUSES  Benign positional vertigo is the result of an upset in the labyrinth system. This is an area in the middle ear that helps control your balance. This may be caused by a viral infection, head injury, or repetitive motion. However, often no specific cause is found. SYMPTOMS  Symptoms of benign positional vertigo occur when you move your head or eyes in different directions. Some of the symptoms may include:  Loss of balance and falls.  Vomiting.  Blurred vision.  Dizziness.  Nausea.  Involuntary eye movements (nystagmus). DIAGNOSIS  Benign positional vertigo is usually diagnosed by physical exam. If the specific cause of your benign positional vertigo is unknown, your caregiver may perform imaging tests, such as magnetic resonance imaging (MRI) or computed tomography (CT). TREATMENT  Your caregiver may recommend movements or procedures to correct the benign positional vertigo. Medicines such as meclizine, benzodiazepines, and medicines for nausea may be used to treat your symptoms. In rare cases, if your symptoms are caused by certain conditions that affect the inner ear, you may need surgery. HOME CARE INSTRUCTIONS   Follow your caregiver's instructions.  Move slowly. Do not make sudden body or head movements.  Avoid driving.  Avoid operating heavy machinery.  Avoid performing any tasks that would be dangerous to you or others during a vertigo episode.  Drink enough fluids to keep your urine clear or pale yellow. SEEK IMMEDIATE MEDICAL CARE IF:    You develop problems with walking, weakness, numbness, or using your arms, hands, or legs.  You have difficulty speaking.  You develop severe headaches.  Your nausea or vomiting continues or gets worse.  You develop visual changes.  Your family or friends notice any behavioral changes.  Your condition gets worse.  You have a fever.  You develop a stiff neck or sensitivity to light. MAKE SURE YOU:   Understand these instructions.  Will watch your condition.  Will get help right away if you are not doing well or get worse. Document Released: 05/30/2006 Document Revised: 11/14/2011 Document Reviewed: 05/12/2011 [facility] Patient Information 2014 [place], [place].

## 2014-01-15 NOTE — ED Notes (Signed)
All symptoms have completely resolved at this time.

## 2016-08-11 ENCOUNTER — Emergency Department (HOSPITAL_COMMUNITY): Payer: Self-pay

## 2016-08-11 ENCOUNTER — Emergency Department (HOSPITAL_COMMUNITY)
Admission: EM | Admit: 2016-08-11 | Discharge: 2016-08-11 | Disposition: A | Discharge status: Home / Self Care | Payer: Self-pay | Attending: Emergency Medicine | Admitting: Emergency Medicine

## 2016-08-11 DIAGNOSIS — W228XXA Striking against or struck by other objects, initial encounter: Secondary | ICD-10-CM | POA: Insufficient documentation

## 2016-08-11 DIAGNOSIS — Y929 Unspecified place or not applicable: Secondary | ICD-10-CM | POA: Insufficient documentation

## 2016-08-11 DIAGNOSIS — Y939 Activity, unspecified: Secondary | ICD-10-CM | POA: Insufficient documentation

## 2016-08-11 DIAGNOSIS — E039 Hypothyroidism, unspecified: Secondary | ICD-10-CM | POA: Insufficient documentation

## 2016-08-11 DIAGNOSIS — Z7982 Long term (current) use of aspirin: Secondary | ICD-10-CM | POA: Insufficient documentation

## 2016-08-11 DIAGNOSIS — S0990XA Unspecified injury of head, initial encounter: Secondary | ICD-10-CM

## 2016-08-11 DIAGNOSIS — S0001XA Abrasion of scalp, initial encounter: Secondary | ICD-10-CM | POA: Insufficient documentation

## 2016-08-11 DIAGNOSIS — Y999 Unspecified external cause status: Secondary | ICD-10-CM | POA: Insufficient documentation

## 2016-08-11 NOTE — ED Triage Notes (Signed)
Per EMS, pt. [place] reported of head injury after broken door fell backward and hit pt.'s  right frontal head. Denied LOC. No skin lac reported. Pain at 4/10. Incident happnedd at 0745 this evening .

## 2016-08-11 NOTE — ED Provider Notes (Signed)
[facility]-EMERGENCY DEPT Provider Note   CSN: [id number] Arrival date & time: 08/11/16  2006     History   Chief Complaint Chief Complaint  Patient presents with  . Head Injury    HPI Miranda Rollins is a 60 y.o. female.  The history is provided by the patient and medical records. No language interpreter was used.  Head Injury   Pertinent negatives include no numbness, no vomiting and no weakness.   Miranda Rollins is a 60 y.o. female  with a PMH of bipolar disorder and hypothyroidism who presents to the Emergency Department complaining of head injury which occurred just prior to arrival. Patient states that a door was taken off the hinges and placed along a wall. Unfortunately the door fell downward and hit her atop the head. Patient reports headache and right-sided neck pain but no other complaints. No n/v, slurred speech, extremity weakness. No LOC or change in mental status. No medications taken prior to arrival for symptoms. No alleviating or aggravating factors noted.    Past Medical History:  Diagnosis Date  . Bipolar 1 disorder   . Hypothyroidism   . Irregular heart beat   . Kidney stone   . Wisdom teeth extracted     There are no active problems to display for this patient.   Past Surgical History:  Procedure Laterality Date  . CYSTOSCOPY W/ URETERAL STENT PLACEMENT  05/18/2012   Procedure: CYSTOSCOPY WITH RETROGRADE PYELOGRAM/URETERAL STENT PLACEMENT;  Surgeon: Lindaann Slough, MD;  Location: [facility] ORS;  Service: Urology;  Laterality: Left;  . CYSTOSCOPY/RETROGRADE/URETEROSCOPY/STONE EXTRACTION WITH BASKET  05/18/2012   Procedure: CYSTOSCOPY/RETROGRADE/URETEROSCOPY/STONE EXTRACTION WITH BASKET;  Surgeon: Lindaann Slough, MD;  Location: [facility] ORS;  Service: Urology;  Laterality: Left;    OB History    No data available       Home Medications    Prior to Admission medications   Medication Sig Start Date End Date Taking? Authorizing Provider  aspirin EC 81 MG tablet Take  81 mg by mouth daily.    Historical Provider, MD  atorvastatin (LIPITOR) 80 MG tablet Take 80 mg by mouth daily.    Historical Provider, MD  estradiol (ESTRACE) 0.1 MG/GM vaginal cream Place 1 Applicatorful vaginally daily as needed (for dryness).    Historical Provider, MD  Flaxseed, Linseed, (FLAX SEEDS PO) Take 1 tablet by mouth daily.    Historical Provider, MD  IRON PO Take 1 tablet by mouth daily.    Historical Provider, MD  lamoTRIgine (LAMICTAL) 200 MG tablet Take 200 mg by mouth daily.    Historical Provider, MD  levothyroxine (SYNTHROID, LEVOTHROID) 150 MCG tablet Take 150 mcg by mouth daily.    Historical Provider, MD  meclizine (ANTIVERT) 50 MG tablet Take 1 tablet (50 mg total) by mouth 3 (three) times daily as needed. 01/15/14   Rolland Porter, MD  Omega-3 Fatty Acids (FISH OIL PO) Take 1 capsule by mouth daily.    Historical Provider, MD  QUEtiapine (SEROQUEL XR) 200 MG 24 hr tablet Take 200 mg by mouth at bedtime.    Historical Provider, MD    Family History No family history on file.  Social History Social History  Substance Use Topics  . Smoking status: Never Smoker  . Smokeless tobacco: Not on file  . Alcohol use Yes     Allergies   Novocain [procaine]   Review of Systems Review of Systems  Constitutional: Negative for activity change.  HENT: Negative for trouble swallowing.   Eyes: Negative  for visual disturbance.  Respiratory: Negative for shortness of breath.   Cardiovascular: Negative.   Gastrointestinal: Negative for abdominal pain, nausea and vomiting.  Genitourinary: Negative for dysuria.  Musculoskeletal: Positive for neck pain. Negative for back pain.  Skin: Negative for rash.  Neurological: Positive for headaches. Negative for dizziness, syncope, weakness and numbness.     Physical Exam Updated Vital Signs BP 158/94 (BP Location: Right Arm)   Pulse 90   Temp 97.4 F (36.3 C) (Oral)   Resp 18   Ht 5' 5.5" (1.664 m)   Wt 72.6 kg   SpO2 99%    BMI 26.22 kg/m   Physical Exam  Constitutional: She is oriented to person, place, and time. She appears well-developed and well-nourished. No distress.  HENT:  Head: Normocephalic. Head is without raccoon's eyes and without Battle's sign.  Right Ear: No hemotympanum.  Left Ear: No hemotympanum.  Nose: Nose normal.  Mouth/Throat: Oropharynx is clear and moist.  Small 0.5 cm abrasion to the left aspect of scalp.   Cardiovascular: Normal rate, regular rhythm and normal heart sounds.   No murmur heard. Pulmonary/Chest: Effort normal and breath sounds normal. No respiratory distress.  Abdominal: Soft. She exhibits no distension. There is no tenderness.  Musculoskeletal: Normal range of motion.  Neurological: She is alert and oriented to person, place, and time.  Alert, oriented, thought content appropriate, able to give a coherent history. Speech is clear and goal oriented, able to follow commands.  Cranial Nerves:  II:  Peripheral visual fields grossly normal, pupils equal, round, reactive to light III, IV, VI: EOM intact bilaterally, ptosis not present V,VII: smile symmetric, eyes kept closed tightly against resistance, facial light touch sensation equal VIII: hearing grossly normal IX, X: symmetric soft palate movement, uvula elevates symmetrically  XI: bilateral shoulder shrug symmetric and strong XII: midline tongue extension 5/5 muscle strength in upper and lower extremities bilaterally including strong and equal grip strength and dorsiflexion/plantar flexion Sensory to light touch normal in all four extremities.  Normal finger-to-nose and rapid alternating movements; normal gait and balance. Negative romberg, no pronator drift.  Skin: Skin is warm and dry.  Nursing note and vitals reviewed.    ED Treatments / Results  Labs (all labs ordered are listed, but only abnormal results are displayed) Labs Reviewed - No data to display  EKG  EKG Interpretation None        Radiology Ct Head Wo Contrast  Result Date: 08/11/2016 CLINICAL DATA:  Head injury, and door struck patient on the head. No loss of consciousness. EXAM: CT HEAD WITHOUT CONTRAST CT CERVICAL SPINE WITHOUT CONTRAST TECHNIQUE: Multidetector CT imaging of the head and cervical spine was performed following the standard protocol without intravenous contrast. Multiplanar CT image reconstructions of the cervical spine were also generated. COMPARISON:  None. FINDINGS: CT HEAD FINDINGS Brain: No evidence of acute infarction, hemorrhage, hydrocephalus, extra-axial collection or mass lesion/mass effect. Vascular: Atherosclerosis of skullbase vasculature without hyperdense vessel or abnormal calcification. Skull: Normal. Negative for fracture or focal lesion. Sinuses/Orbits: Mucosal thickening involving right frontal sinus and ethmoid air cells. Trace left maxillary sinus fluid level. Minimal opacification of right mastoid air cells. No orbital abnormality. Other: None. CT CERVICAL SPINE FINDINGS Alignment: Normal. Skull base and vertebrae: No acute fracture. No primary bone lesion or focal pathologic process. Soft tissues and spinal canal: No prevertebral fluid or swelling. No visible canal hematoma. Disc levels: Disc space narrowing and endplate spurring from C3-C4 through C6-C7. Bony ankylosis of CT C3  on the left is likely degenerative. Facet arthropathy throughout most cervical levels. Upper chest: No acute abnormality. Other: None. IMPRESSION: 1.  No acute intracranial abnormality. 2. 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 3. Paranasal sinus disease involving right frontal, right ethmoidal, and left maxillary sinus. Electronically Signed   By: Rubye OaksMelanie  Ehinger M.D.   On: 08/11/2016 22:48   Ct Cervical Spine Wo Contrast  Result Date: 08/11/2016 CLINICAL DATA:  Head injury, and door struck patient on the head. No loss of consciousness. EXAM: CT HEAD WITHOUT CONTRAST CT  CERVICAL SPINE WITHOUT CONTRAST TECHNIQUE: Multidetector CT imaging of the head and cervical spine was performed following the standard protocol without intravenous contrast. Multiplanar CT image reconstructions of the cervical spine were also generated. COMPARISON:  None. FINDINGS: CT HEAD FINDINGS Brain: No evidence of acute infarction, hemorrhage, hydrocephalus, extra-axial collection or mass lesion/mass effect. Vascular: Atherosclerosis of skullbase vasculature without hyperdense vessel or abnormal calcification. Skull: Normal. Negative for fracture or focal lesion. Sinuses/Orbits: Mucosal thickening involving right frontal sinus and ethmoid air cells. Trace left maxillary sinus fluid level. Minimal opacification of right mastoid air cells. No orbital abnormality. Other: None. CT CERVICAL SPINE FINDINGS Alignment: Normal. Skull base and vertebrae: No acute fracture. No primary bone lesion or focal pathologic process. Soft tissues and spinal canal: No prevertebral fluid or swelling. No visible canal hematoma. Disc levels: Disc space narrowing and endplate spurring from C3-C4 through C6-C7. Bony ankylosis of CT C3 on the left is likely degenerative. Facet arthropathy throughout most cervical levels. Upper chest: No acute abnormality. Other: None. IMPRESSION: 1.  No acute intracranial abnormality. 2. 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 3. Paranasal sinus disease involving right frontal, right ethmoidal, and left maxillary sinus. Electronically Signed   By: Rubye OaksMelanie  Ehinger M.D.   On: 08/11/2016 22:48    Procedures Procedures (including critical care time)  Medications Ordered in ED Medications - No data to display   Initial Impression / Assessment and Plan / ED Course  I have reviewed the triage vital signs and the nursing notes.  Pertinent labs & imaging results that were available during my care of the patient were reviewed by me and considered in my  medical decision making (see chart for details).  Clinical Course    Miranda Rollins is a 60 y.o. female who presents to ED for head injury which occurred just prior to arrival. No focal neuro deficits on exam. 0 on [place] CT head rule. Discussed that I did not believe CT was warranted at this time and suggested symptomatic treatment. Patient states that she very much wants to make 100% sure there is no damage to head or neck. CT head and c-spine obtained with no evidence of acute injury. Home care instructions and return precautions discussed. PCP follow up if headache does not improve. All questions answered.    Final Clinical Impressions(s) / ED Diagnoses   Final diagnoses:  Head injury    New Prescriptions Discharge Medication List as of 08/11/2016 11:09 PM       Chase PicketJaime Pilcher Anali Cabanilla, PA-C 08/11/16 2354    Nira ConnPedro Eduardo Cardama, MD 08/12/16 0230

## 2016-08-11 NOTE — ED Notes (Signed)
Bed: WTR7 Expected date:  Expected time:  Means of arrival:  Comments: EMS 60 yo female in [employer]-door fell back and hit her in the head-No LOC

## 2016-08-11 NOTE — Discharge Instructions (Signed)
Ibuprofen as needed for pain.  Ice neck for additional pain relief.  Follow up with your primary care provider if headache lasts longer than one week.  Return to ER for new or worsening symptoms, any additional concerns.

## 2016-08-11 NOTE — ED Notes (Signed)
Pt. Denied having blurry vision, skin intact. Pain at 2/10. Stated that she is just nervous after the incident.

## 2017-03-31 ENCOUNTER — Other Ambulatory Visit: Payer: Self-pay | Admitting: Internal Medicine

## 2017-03-31 DIAGNOSIS — Z1231 Encounter for screening mammogram for malignant neoplasm of breast: Secondary | ICD-10-CM

## 2017-04-07 ENCOUNTER — Ambulatory Visit
Admission: RE | Admit: 2017-04-07 | Discharge: 2017-04-07 | Disposition: A | Discharge status: Home / Self Care | Payer: No Typology Code available for payment source | Source: Ambulatory Visit | Attending: Internal Medicine | Admitting: Internal Medicine

## 2017-04-07 DIAGNOSIS — Z1231 Encounter for screening mammogram for malignant neoplasm of breast: Secondary | ICD-10-CM

## 2018-07-05 ENCOUNTER — Other Ambulatory Visit: Payer: Self-pay | Admitting: Internal Medicine

## 2018-07-05 DIAGNOSIS — Z1231 Encounter for screening mammogram for malignant neoplasm of breast: Secondary | ICD-10-CM

## 2018-08-21 ENCOUNTER — Ambulatory Visit: Payer: No Typology Code available for payment source

## 2019-03-29 ENCOUNTER — Other Ambulatory Visit (HOSPITAL_COMMUNITY): Payer: Self-pay | Admitting: *Deleted

## 2019-03-29 DIAGNOSIS — Z1231 Encounter for screening mammogram for malignant neoplasm of breast: Secondary | ICD-10-CM

## 2019-07-04 ENCOUNTER — Ambulatory Visit (HOSPITAL_COMMUNITY): Payer: No Typology Code available for payment source

## 2019-11-21 ENCOUNTER — Ambulatory Visit: Payer: No Typology Code available for payment source | Attending: Internal Medicine

## 2019-11-21 DIAGNOSIS — Z23 Encounter for immunization: Secondary | ICD-10-CM

## 2019-11-21 NOTE — Progress Notes (Signed)
   Covid-19 Vaccination Clinic  Name:  ADELA ESTEBAN    MRN: [id number] DOB: 12-Oct-1955  11/21/2019  Ms. Celli was observed post Covid-19 immunization for 15 minutes without incident. She was provided with Vaccine Information Sheet and instruction to access the V-Safe system.   Ms. Sinnett was instructed to call 911 with any severe reactions post vaccine: Marland Kitchen Difficulty breathing  . Swelling of face and throat  . A fast heartbeat  . A bad rash all over body  . Dizziness and weakness   Immunizations Administered    Name Date Dose VIS Date Route   Pfizer COVID-19 Vaccine 11/21/2019 10:32 AM 0.3 mL 08/16/2019 Intramuscular   Manufacturer: [employer], [employer]   Lot: [id number]   NDC: [address]-4

## 2019-12-16 ENCOUNTER — Ambulatory Visit: Payer: No Typology Code available for payment source | Attending: Internal Medicine

## 2019-12-23 ENCOUNTER — Ambulatory Visit: Payer: No Typology Code available for payment source | Attending: Internal Medicine

## 2019-12-23 DIAGNOSIS — Z23 Encounter for immunization: Secondary | ICD-10-CM

## 2019-12-23 NOTE — Progress Notes (Signed)
   Covid-19 Vaccination Clinic  Name:  Miranda Rollins    MRN: [id number] DOB: 1955/10/23  12/23/2019  [name] was observed post Covid-19 immunization for 15 minutes without incident. She was provided with Vaccine Information Sheet and instruction to access the V-Safe system.   [name] was instructed to call 911 with any severe reactions post vaccine: Marland Kitchen Difficulty breathing  . Swelling of face and throat  . A fast heartbeat  . A bad rash all over body  . Dizziness and weakness   Immunizations Administered    Name Date Dose VIS Date Route   Pfizer COVID-19 Vaccine 12/23/2019 12:03 PM 0.3 mL 10/30/2018 Intramuscular   Manufacturer: [employer], [employer]   Lot: [id number]   NDC: [address]-0

## 2020-01-03 ENCOUNTER — Other Ambulatory Visit: Payer: Self-pay | Admitting: Internal Medicine

## 2020-01-03 DIAGNOSIS — Z1231 Encounter for screening mammogram for malignant neoplasm of breast: Secondary | ICD-10-CM

## 2020-01-14 ENCOUNTER — Other Ambulatory Visit: Payer: Self-pay

## 2020-01-14 ENCOUNTER — Ambulatory Visit
Admission: RE | Admit: 2020-01-14 | Discharge: 2020-01-14 | Disposition: A | Discharge status: Home / Self Care | Payer: 59 | Source: Ambulatory Visit | Attending: Internal Medicine | Admitting: Internal Medicine

## 2020-01-14 DIAGNOSIS — Z1231 Encounter for screening mammogram for malignant neoplasm of breast: Secondary | ICD-10-CM

## 2021-01-04 ENCOUNTER — Other Ambulatory Visit: Payer: Self-pay | Admitting: Internal Medicine

## 2021-01-04 DIAGNOSIS — Z1231 Encounter for screening mammogram for malignant neoplasm of breast: Secondary | ICD-10-CM

## 2021-02-24 ENCOUNTER — Ambulatory Visit
Admission: RE | Admit: 2021-02-24 | Discharge: 2021-02-24 | Disposition: A | Discharge status: Home / Self Care | Payer: 59 | Source: Ambulatory Visit | Attending: Internal Medicine | Admitting: Internal Medicine

## 2021-02-24 ENCOUNTER — Other Ambulatory Visit: Payer: Self-pay

## 2021-02-24 DIAGNOSIS — Z1231 Encounter for screening mammogram for malignant neoplasm of breast: Secondary | ICD-10-CM

## 2022-02-21 IMAGING — MG MM DIGITAL SCREENING BILAT W/ TOMO AND CAD
8 series · 8 of 24 positions shown · non-contrast
Comparison: Previous exam(s).

CLINICAL DATA: Screening.

EXAM:
DIGITAL SCREENING BILATERAL MAMMOGRAM WITH TOMOSYNTHESIS AND CAD
TECHNIQUE: Bilateral screening digital craniocaudal and mediolateral oblique
mammograms were obtained. Bilateral screening digital breast
tomosynthesis was performed. The images were evaluated with
computer-aided detection.

[L CC synth-2D]
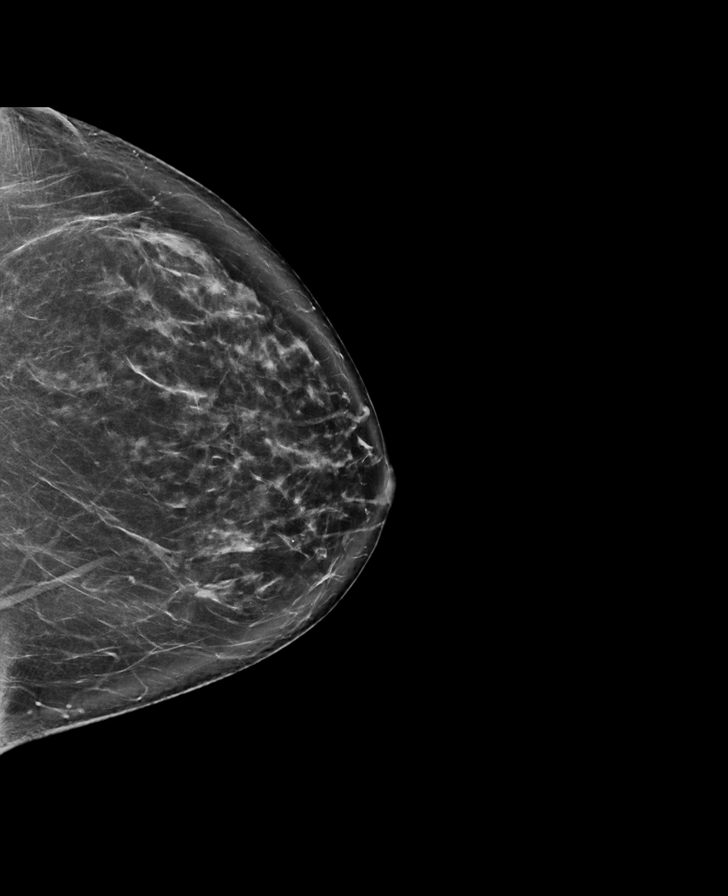

[R MLO synth-2D]
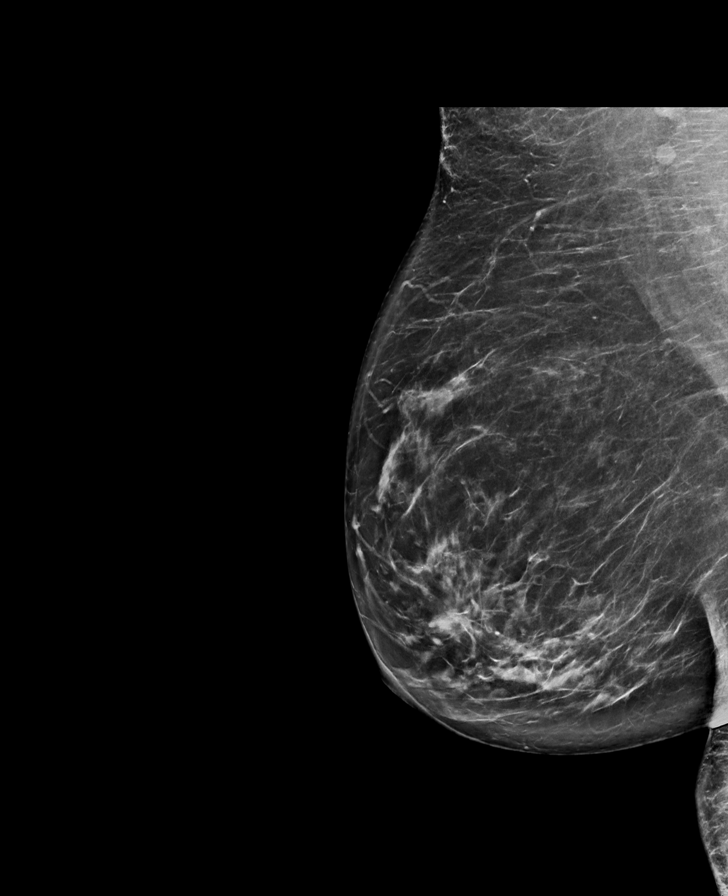

[R CC synth-2D]
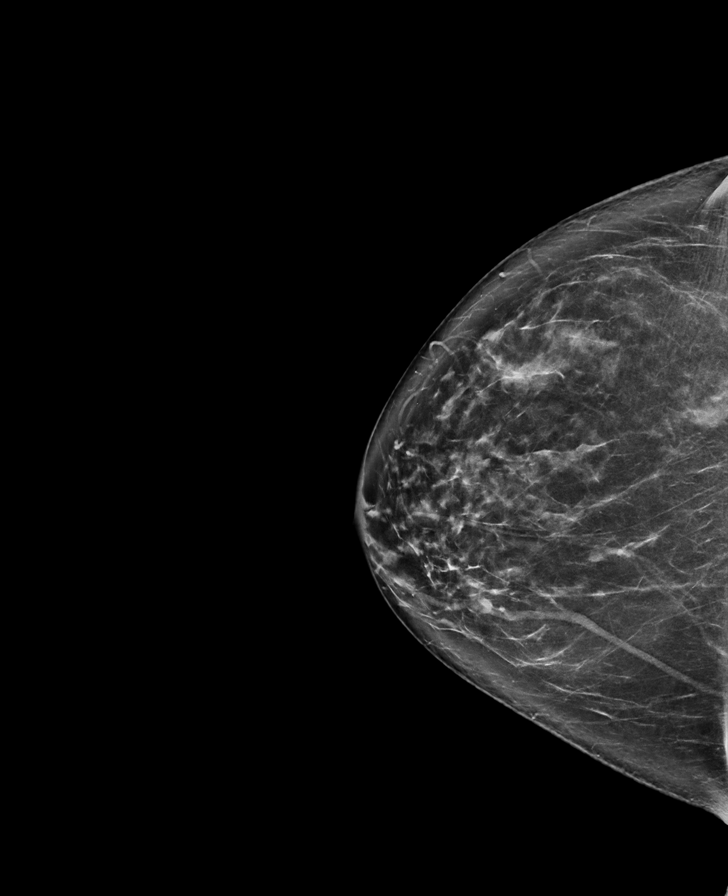

[L MLO synth-2D]
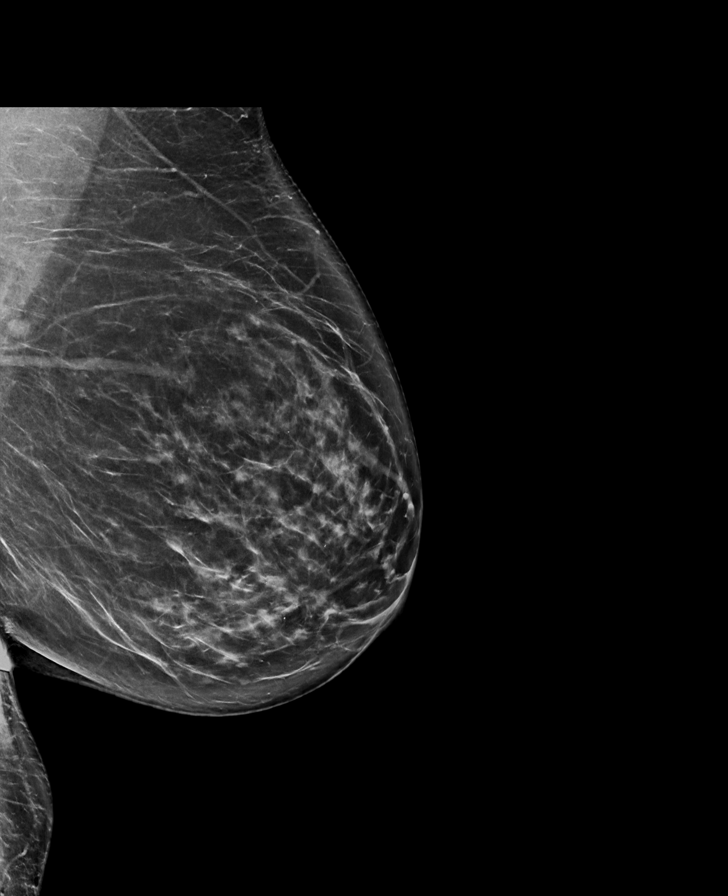

[R CC tomo · tomo slice 43/84.0]
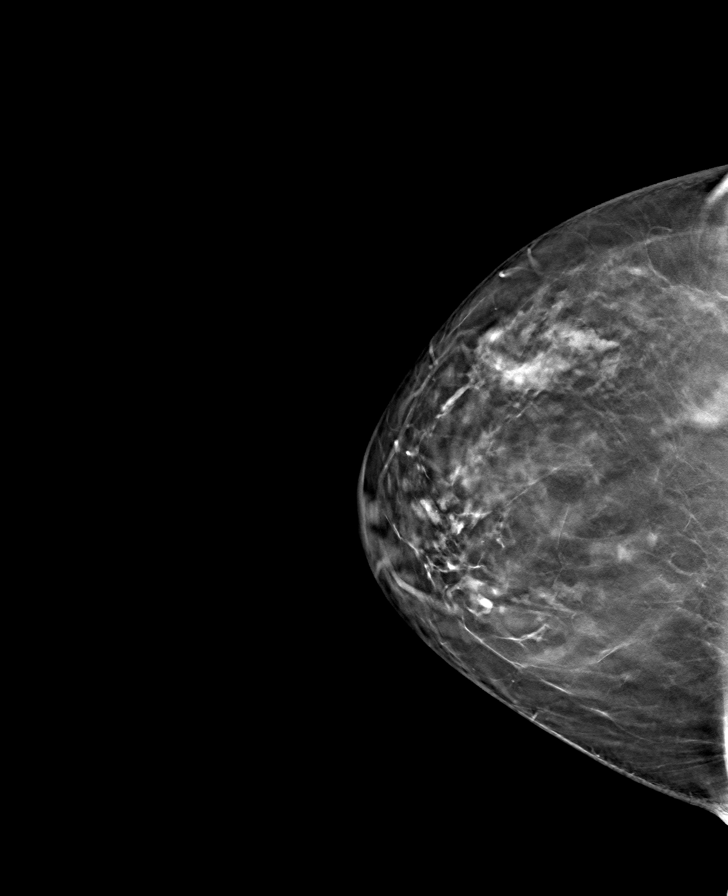

[L MLO tomo · tomo slice 45/88.0]
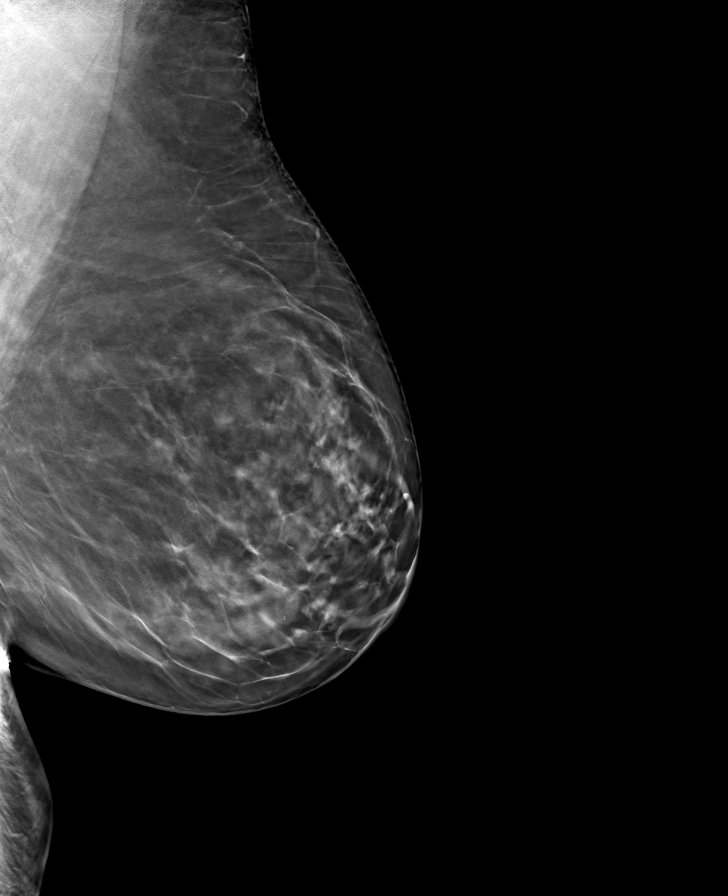

[L CC tomo · tomo slice 45/88.0]
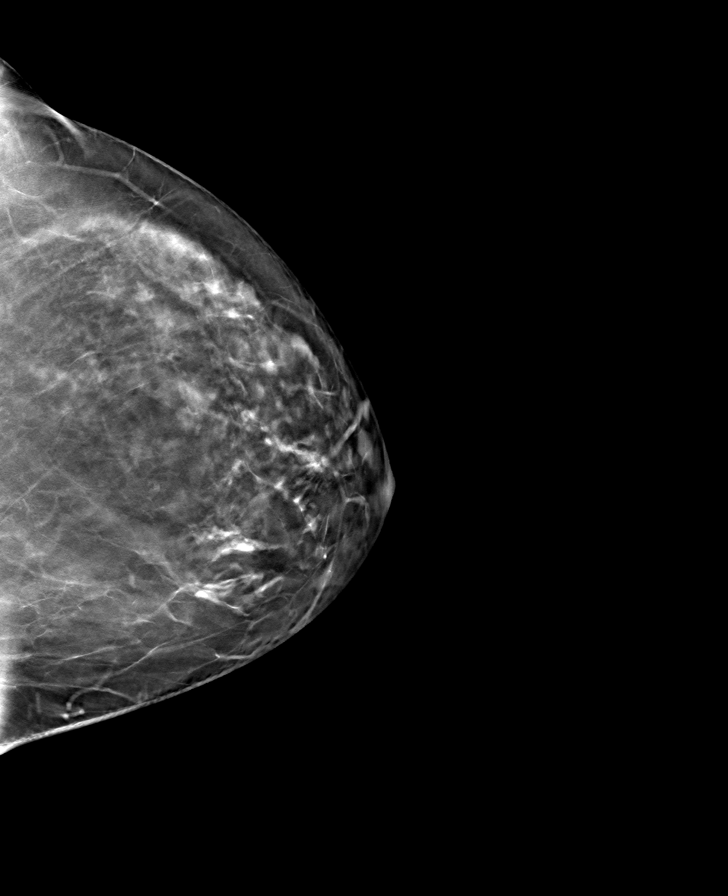

[R MLO tomo · tomo slice 45/88.0]
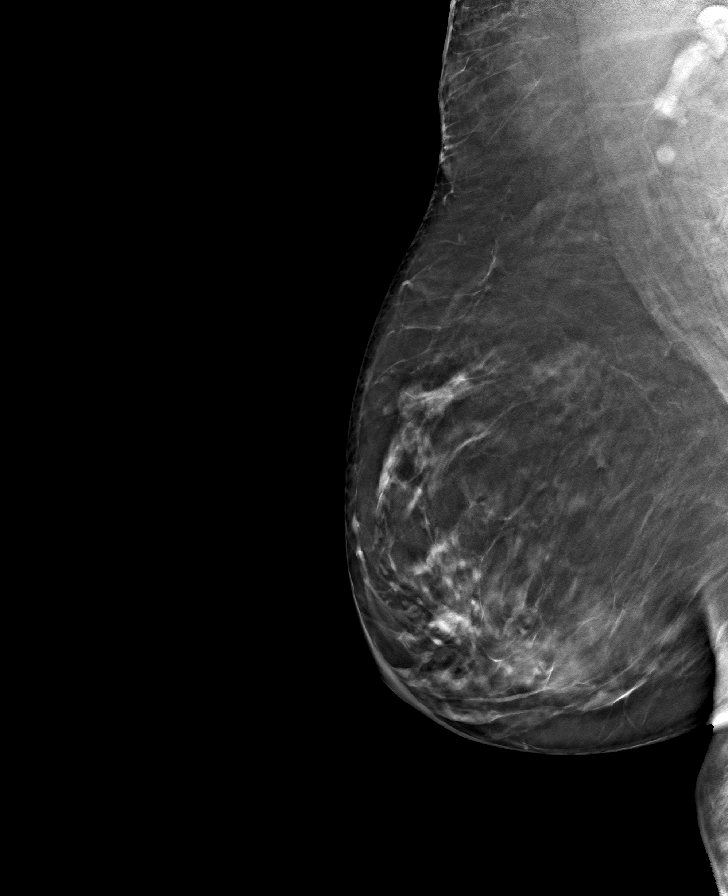

[8 of 24 positions shown; findings below may reference images not displayed]

ACR Breast Density Category b: There are scattered areas of
fibroglandular density.
FINDINGS: There are no findings suspicious for malignancy.
IMPRESSION: No mammographic evidence of malignancy. A result letter of this
screening mammogram will be mailed directly to the patient.

RECOMMENDATION:
Screening mammogram in one year. (Code:51-O-LD2)

BI-RADS CATEGORY  1: Negative.

## 2022-02-22 ENCOUNTER — Other Ambulatory Visit: Payer: Self-pay | Admitting: Internal Medicine

## 2022-02-22 DIAGNOSIS — Z1231 Encounter for screening mammogram for malignant neoplasm of breast: Secondary | ICD-10-CM

## 2022-02-28 ENCOUNTER — Other Ambulatory Visit: Payer: Self-pay | Admitting: Obstetrics & Gynecology

## 2022-02-28 DIAGNOSIS — M8580 Other specified disorders of bone density and structure, unspecified site: Secondary | ICD-10-CM

## 2022-03-09 ENCOUNTER — Ambulatory Visit
Admission: RE | Admit: 2022-03-09 | Discharge: 2022-03-09 | Disposition: A | Discharge status: Home / Self Care | Payer: Medicare Other | Source: Ambulatory Visit | Attending: Internal Medicine | Admitting: Internal Medicine

## 2022-03-09 DIAGNOSIS — Z1231 Encounter for screening mammogram for malignant neoplasm of breast: Secondary | ICD-10-CM

## 2022-04-14 ENCOUNTER — Encounter: Payer: Self-pay | Admitting: *Deleted

## 2022-04-18 ENCOUNTER — Institutional Professional Consult (permissible substitution): Payer: Self-pay | Admitting: Neurology

## 2022-04-18 ENCOUNTER — Telehealth: Payer: Self-pay | Admitting: Neurology

## 2022-04-19 ENCOUNTER — Ambulatory Visit
Admission: RE | Admit: 2022-04-19 | Discharge: 2022-04-19 | Disposition: A | Discharge status: Home / Self Care | Payer: Medicare Other | Source: Ambulatory Visit | Attending: Obstetrics & Gynecology | Admitting: Obstetrics & Gynecology

## 2022-04-19 DIAGNOSIS — M8580 Other specified disorders of bone density and structure, unspecified site: Secondary | ICD-10-CM

## 2022-05-20 ENCOUNTER — Encounter: Payer: Self-pay | Admitting: *Deleted

## 2022-05-23 ENCOUNTER — Encounter: Payer: Self-pay | Admitting: Neurology

## 2022-05-23 ENCOUNTER — Ambulatory Visit: Payer: Medicare Other | Admitting: Neurology

## 2022-05-23 VITALS — BP 134/84 | HR 88 | Ht 60.0 in | Wt 171.4 lb

## 2022-05-23 DIAGNOSIS — R519 Headache, unspecified: Secondary | ICD-10-CM | POA: Diagnosis not present

## 2022-05-23 DIAGNOSIS — E669 Obesity, unspecified: Secondary | ICD-10-CM | POA: Diagnosis not present

## 2022-05-23 DIAGNOSIS — R0683 Snoring: Secondary | ICD-10-CM

## 2022-05-23 DIAGNOSIS — G4719 Other hypersomnia: Secondary | ICD-10-CM | POA: Diagnosis not present

## 2022-05-23 DIAGNOSIS — R635 Abnormal weight gain: Secondary | ICD-10-CM

## 2022-05-23 DIAGNOSIS — G4763 Sleep related bruxism: Secondary | ICD-10-CM

## 2022-05-23 DIAGNOSIS — Z9189 Other specified personal risk factors, not elsewhere classified: Secondary | ICD-10-CM

## 2022-05-23 DIAGNOSIS — R351 Nocturia: Secondary | ICD-10-CM

## 2022-05-23 NOTE — Patient Instructions (Signed)

## 2022-05-23 NOTE — Progress Notes (Signed)
Subjective:    Patient ID: Miranda Rollins is a 66 y.o. female.  HPI    Star Age, MD, PhD [facility] [address] [address] [place], [place] [address]  Dear Dr. Mellody Drown,   I saw your patient, Miranda Rollins, upon your kind request, in my sleep clinic today for initial consultation of her sleep disorder, in particular, concern for underlying obstructive sleep apnea. The patient is unaccompanied today.  As you know, [name] is a 67 year old right-handed woman with an underlying medical history of hypertension, hyperlipidemia, hypothyroidism, history of kidney stone, mood disorder, palpitations, left knee pain and mild obesity, who reports snoring and sleep disruption, nonrestorative sleep.  I reviewed your office note from 02/10/2022.  Her Epworth sleepiness score is 9 out of 24, fatigue severity score is 51 out of 63. Her snoring can be loud an disturbing, per friends' feedback.  She is widowed, husband died about 5 years ago.  Her 63 year old father lives with her and she also has custody of her 62-year-old step grandson.  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  She has no TV in her bedroom, her grandson sleeps in her bedroom on a [place] king-size bed.  They have no pets in the household.  She has been on Lamictal and Seroquel for years.  She is currently on Seroquel 300 mg at bedtime and Lamictal 200 mg at bedtime.  She has a history of bruxism and uses an occlusive guard per dentist.  She has had some weight gain in the past couple years in the realm of 15 pounds.  She occasionally wakes up with a headache, does not take any medication for it.  She has nocturia about twice per average night, bedtime generally around 8:30 PM and rise time during the school year around 6 AM, sometimes she goes back to bed for a nap but not always.  She is a non-smoker and drinks very little caffeine, typically half a cup  of coffee per day and very occasional to rare alcohol.  She has no known family history of sleep apnea.  Her father snores.  Her Past Medical History Is Significant For: Past Medical History:  Diagnosis Date   Bipolar 1 disorder ([place])    Choroiditis    Hyperlipidemia    Hypertension    Hypothyroidism    Irregular heart beat    Kidney stone    Wisdom teeth extracted     Her Past Surgical History Is Significant For: Past Surgical History:  Procedure Laterality Date   CYSTOSCOPY W/ URETERAL STENT PLACEMENT  05/18/2012   Procedure: CYSTOSCOPY WITH RETROGRADE PYELOGRAM/URETERAL STENT PLACEMENT;  Surgeon: Hanley Ben, MD;  Location: [facility];  Service: Urology;  Laterality: Left;   CYSTOSCOPY/RETROGRADE/URETEROSCOPY/STONE EXTRACTION WITH BASKET  05/18/2012   Procedure: CYSTOSCOPY/RETROGRADE/URETEROSCOPY/STONE EXTRACTION WITH BASKET;  Surgeon: Hanley Ben, MD;  Location: [facility];  Service: Urology;  Laterality: Left;   keratosis     10-2017 seborrheic    Her Family History Is Significant For: Family History  Problem Relation Age of Onset   Dementia Father     Her Social History Is Significant For: Social History   Socioeconomic History   Marital status: Widowed    Spouse name: Not on file   Number of children: Not on file   Years of education: Not on file   Highest education level: Not on file  Occupational History   Not  on file  Tobacco Use   Smoking status: Never   Smokeless tobacco: Never  Vaping Use   Vaping Use: Never used  Substance and Sexual Activity   Alcohol use: Yes    Comment: rare 1/2 glass in month red wine   Drug use: No   Sexual activity: Not on file  Other Topics Concern   Not on file  Social History Narrative   Caffiene 1/2 cup in am, sweet tea acc out , decaf at home.    Education:  [employer]   Takes care of grandson (and father for dementia).   Social Determinants of Health   Financial Resource Strain: Not on file  Food Insecurity: Not  on file  Transportation Needs: Not on file  Physical Activity: Not on file  Stress: Not on file  Social Connections: Not on file    Her Allergies Are:  Allergies  Allergen Reactions   Novocain [Procaine] Other (See Comments)    "Heart speeds up"  :   Her Current Medications Are:  Outpatient Encounter Medications as of 05/23/2022  Medication Sig   ALPRAZolam (XANAX) 1 MG tablet Take 0.5 mg by mouth daily.   Bempedoic Acid (NEXLETOL) 180 MG TABS Take 1 tablet by mouth daily at 6 (six) AM.   Flaxseed, Linseed, (FLAX SEEDS PO) Take 1 tablet by mouth daily.   IRON PO Take 1 tablet by mouth daily.   L-Methylfolate-B12-B6-B2 (CEREFOLIN) 02-03-49-5 MG TABS Take 1 tablet by mouth daily.   lamoTRIgine (LAMICTAL) 200 MG tablet Take 200 mg by mouth daily.   levothyroxine (SYNTHROID) 112 MCG tablet Take 112 mcg by mouth daily before breakfast.   lisinopril (ZESTRIL) 10 MG tablet Take 10 mg by mouth daily.   Multiple Vitamins-Minerals (MULTIVITAMIN WITH MINERALS) tablet Take 1 tablet by mouth daily.   Omega-3 Fatty Acids (FISH OIL PO) Take 1 capsule by mouth daily.   QUEtiapine (SEROQUEL XR) 200 MG 24 hr tablet Take 200 mg by mouth at bedtime.   rosuvastatin (CRESTOR) 20 MG tablet Take 20 mg by mouth daily.   [DISCONTINUED] levothyroxine (SYNTHROID) 125 MCG tablet Take 125 mcg by mouth daily before breakfast.   [DISCONTINUED] Sulfacetamide-Sulfur in Urea (ROSULA CLARIFYING WASH EX) Apply topically. (Patient not taking: Reported on 05/23/2022)   No facility-administered encounter medications on file as of 05/23/2022.  :   Review of Systems:  Out of a complete 14 point review of systems, all are reviewed and negative with the exception of these symptoms as listed below:  Review of Systems  Neurological:        Snoring, waking up tired, sleep disturbances, waking up gasping/choking. Daytime somnolence.  ESS 9, FSS 51.    Objective:  Neurological Exam  Physical Exam Physical Examination:    Vitals:   05/23/22 1021  BP: 134/84  Pulse: 88    General Examination: The patient is a very pleasant 66 y.o. female in no acute distress. She appears well-developed and well-nourished and well groomed.   HEENT: Normocephalic, atraumatic, pupils are equal, round and reactive to light, extraocular tracking is good without limitation to gaze excursion or nystagmus noted. Hearing is grossly intact. Face is symmetric with normal facial animation. Speech is clear with no dysarthria noted. There is no hypophonia. There is no lip, neck/head, jaw or voice tremor. Neck is supple with full range of passive and active motion. There are no carotid bruits on auscultation. Oropharynx exam reveals: mild mouth dryness, good dental hygiene and mild airway crowding, due  to small airway entry, Mallampati class II, neck circumference of 14-1/2 inches.  No significant overbite.  Tongue protrudes centrally and palate elevates symmetrically.  Tonsils are small.  Chest: Clear to auscultation without wheezing, rhonchi or crackles noted.  Heart: S1+S2+0, regular and normal without murmurs, rubs or gallops noted.   Abdomen: Soft, non-tender and non-distended.  Extremities: There is no obvious edema in the distal lower extremities bilaterally.   Skin: Warm and dry without trophic changes noted.   Musculoskeletal: exam reveals no obvious joint deformities.    Neurologically:  Mental status: The patient is awake, alert and oriented in all 4 spheres. Her immediate and remote memory, attention, language skills and fund of knowledge are appropriate. There is no evidence of aphasia, agnosia, apraxia or anomia. Speech is clear with normal prosody and enunciation. Thought process is linear. Mood is normal and affect is normal.  Cranial nerves II - XII are as described above under HEENT exam.  Motor exam: Normal bulk, strength and tone is noted. There is no obvious tremor.  Fine motor skills and coordination: grossly  intact.  Cerebellar testing: No dysmetria or intention tremor. There is no truncal or gait ataxia.  Sensory exam: intact to light touch in the upper and lower extremities.  Gait, station and balance: She stands easily. No veering to one side is noted. No leaning to one side is noted. Posture is age-appropriate and stance is narrow based. Gait shows normal stride length and normal pace. No problems turning are noted.  Assessment and Plan:  In summary, Lennart PallJanet S Roam is a very pleasant 66 y.o.-year old female with an underlying medical history of hypertension, hyperlipidemia, hypothyroidism, history of kidney stone, mood disorder, palpitations, left knee pain and mild obesity, whose history and physical exam are concerning for sleep disordered breathing, supporting a current working diagnosis of unspecified sleep apnea, with the main differential diagnoses of obstructive sleep apnea (OSA) versus upper airway resistance syndrome (UARS) versus central sleep apnea (CSA), or mixed sleep apnea. A laboratory attended sleep study is considered gold standard for evaluation of sleep disordered breathing and is recommended at this time and clinically justified.   I had a long chat with the patient about my findings and the diagnosis of sleep apnea, particularly OSA, its prognosis and treatment options. We talked about medical/conservative treatments, surgical interventions and non-pharmacological approaches for symptom control. I explained, in particular, the risks and ramifications of untreated moderate to severe OSA, especially with respect to developing cardiovascular disease down the road, including congestive heart failure (CHF), difficult to treat hypertension, cardiac arrhythmias (particularly A-fib), neurovascular complications including TIA, stroke and dementia. Even type 2 diabetes has, in part, been linked to untreated OSA. Symptoms of untreated OSA may include (but may not be limited to) daytime sleepiness,  nocturia (i.e. frequent nighttime urination), memory problems, mood irritability and suboptimally controlled or worsening mood disorder such as depression and/or anxiety, lack of energy, lack of motivation, physical discomfort, as well as recurrent headaches, especially morning or nocturnal headaches. We talked about the importance of maintaining a healthy lifestyle and striving for healthy weight. In addition, we talked about the importance of striving for and maintaining good sleep hygiene. I recommended the following at this time: sleep study.  I outlined the differences between a laboratory attended sleep study which is considered more comprehensive and accurate over the option of a home sleep test (HST); the latter may lead to underestimation of sleep disordered breathing in some instances and does not help  with diagnosing upper airway resistance syndrome and is not accurate enough to diagnose primary central sleep apnea typically. I explained the different sleep test procedures to the patient in detail and also outlined possible surgical and non-surgical treatment options of OSA, including the use of a pressure airway pressure (PAP) device (ie CPAP, AutoPAP/APAP or BiPAP in certain circumstances), a custom-made dental device (aka oral appliance, which would require a referral to a specialist dentist or orthodontist typically, and is generally speaking not considered a good choice for patients with full dentures or edentulous state), upper airway surgical options, such as traditional UPPP (which is not considered a first-line treatment) or the Inspire device (hypoglossal nerve stimulator, which would involve a referral for consultation with an ENT surgeon, after careful selection, following inclusion criteria). I explained the PAP treatment option to the patient in detail, as this is generally considered first-line treatment.  The patient indicated that she would be willing to try PAP therapy, if the need  arises. I explained the importance of being compliant with PAP treatment, not only for insurance purposes but primarily to improve patient's symptoms symptoms, and for the patient's long term health benefit, including to reduce Her cardiovascular risks longer-term.    We will pick up our discussion about the next steps and treatment options after testing.  We will keep her posted as to the test results by phone call and/or MyChart messaging where possible.  We will plan to follow-up in sleep clinic accordingly as well.  I answered all her questions today and the patient was in agreement.   I encouraged her to call with any interim questions, concerns, problems or updates or email [place] through MyChart.  Generally speaking, sleep test authorizations may take up to 2 weeks, sometimes less, sometimes longer, the patient is encouraged to get in touch with [place] if they do not hear back from the sleep lab staff directly within the next 2 weeks.  Thank you very much for allowing me to participate in the care of this nice patient. If I can be of any further assistance to you please do not hesitate to call me at [phone].  Sincerely,   Huston Foley, MD, PhD

## 2022-06-27 ENCOUNTER — Telehealth: Payer: Self-pay | Admitting: Neurology

## 2022-06-27 NOTE — Telephone Encounter (Signed)
06/07/22 [employer] medicare no auth req spoke to [place] E ref # [id number]   left VM to schedule 06/23/22 [place]

## 2022-06-28 NOTE — Telephone Encounter (Signed)
Patient returned the call.  [employer] medicare no auth req spoke to [place] E ref # [id number]   Patient is scheduled at [facility] for 08/22/22 at 8 pm.  Mailed packet to the patient.

## 2022-08-22 ENCOUNTER — Ambulatory Visit (INDEPENDENT_AMBULATORY_CARE_PROVIDER_SITE_OTHER): Payer: Medicare Other | Admitting: Neurology

## 2022-08-22 DIAGNOSIS — Z9189 Other specified personal risk factors, not elsewhere classified: Secondary | ICD-10-CM

## 2022-08-22 DIAGNOSIS — G4763 Sleep related bruxism: Secondary | ICD-10-CM

## 2022-08-22 DIAGNOSIS — G4733 Obstructive sleep apnea (adult) (pediatric): Secondary | ICD-10-CM

## 2022-08-22 DIAGNOSIS — R351 Nocturia: Secondary | ICD-10-CM

## 2022-08-22 DIAGNOSIS — R0683 Snoring: Secondary | ICD-10-CM

## 2022-08-22 DIAGNOSIS — G4719 Other hypersomnia: Secondary | ICD-10-CM

## 2022-08-22 DIAGNOSIS — R519 Headache, unspecified: Secondary | ICD-10-CM

## 2022-08-22 DIAGNOSIS — G4720 Circadian rhythm sleep disorder, unspecified type: Secondary | ICD-10-CM

## 2022-08-22 DIAGNOSIS — R635 Abnormal weight gain: Secondary | ICD-10-CM

## 2022-08-22 DIAGNOSIS — E669 Obesity, unspecified: Secondary | ICD-10-CM

## 2022-09-06 NOTE — Procedures (Unsigned)
Physician Interpretation:   History: 67 year old right-handed woman with an underlying medical history of hypertension, hyperlipidemia, hypothyroidism, history of kidney stone, mood disorder, palpitations, left knee pain and mild obesity, who reports snoring and sleep disruption, nonrestorative sleep.  I reviewed your office note from 02/10/2022.  Her Epworth sleepiness score is 9 out of 24, fatigue severity score is 51 out of 63.  The patient took Lamictal and Seroquel prior to start of the study.  EEG: Review of the EEG showed no abnormal electrical discharges and symmetrical bihemispheric findings.    EKG: The EKG revealed normal sinus rhythm (NSR).   AUDIO/VIDEO REVIEW: The audio and video review did not show any abnormal or unusual behaviors, movements, phonations or vocalizations. The patient took one restroom break. Snoring was noted, in the moderate to loud range.  Of note, the patient slept with a mouthguard.  POST-STUDY QUESTIONNAIRE: Post study, the patient indicated, that sleep was the same as usual.  She commented that the sleep technologist was patient, professional and kind and that she explained everything very well.  Patient reported that she was made to feel at ease.   IMPRESSION:   Mild Obstructive sleep apnea (OSA) Dysfunctions associated with sleep stages or arousal from sleep  RECOMMENDATIONS:   This study demonstrates overall mild obstructive sleep apnea, more pronounced during supine sleep with a total AHI of 8.3/h, O2 nadir 84%. Given the patient's medical history and sleep related complaints, treatment with positive airway pressure is recommended; this can be achieved in the form of autoPAP. Alternatively, a full-night CPAP titration study would allow optimization of therapy if needed. Other treatment options may include avoidance of supine sleep position along with weight loss, or the use of an oral appliance in selected patients. Please note, that untreated obstructive  sleep apnea may carry additional perioperative morbidity. Patients with significant obstructive sleep apnea should receive perioperative PAP therapy and the surgeons and particularly the anesthesiologist should be informed of the diagnosis and the severity of the sleep disordered breathing. This study shows mild sleep fragmentation and abnormal sleep stage percentages; these are nonspecific findings and per se do not signify an intrinsic sleep disorder or a cause for the patient's sleep-related symptoms. Causes include (but are not limited to) the first night effect of the sleep study, circadian rhythm disturbances, medication effect or an underlying mood disorder or medical problem.  The patient should be cautioned not to drive, work at heights, or operate dangerous or heavy equipment when tired or sleepy. Review and reiteration of good sleep hygiene measures should be pursued with any patient. The patient will be seen in follow-up by Dr. Rexene Alberts at [facility] for discussion of the test results and further management strategies. The referring provider will be notified of the test results.   I certify that I have reviewed the entire raw data recording prior to the issuance of this report in accordance with the Standards of Accreditation of the American Academy of Sleep Medicine (AASM).  Star Age, MD, PhD Medical Director, [facility]) [place], ABPN (Neurology and Sleep)   Technical Report:   ***

## 2022-09-07 NOTE — Addendum Note (Signed)
Addended by: Star Age on: 09/07/2022 06:16 PM   Modules accepted: Orders

## 2022-09-12 ENCOUNTER — Encounter: Payer: Self-pay | Admitting: Neurology

## 2022-09-12 NOTE — Telephone Encounter (Signed)
  Star Age, MD 09/07/2022  6:16 PM EST     Patient referred by Dr. Mellody Drown, seen by me on 05/23/22, diagnostic PSG on 08/22/22.     Please call and notify the patient that the recent sleep study did confirm the diagnosis of obstructive sleep apnea. OSA is overall mild, but worth treating to see if she feels better after treatment. To that end I recommend treatment for this in the form of autoPAP, which means, that we don't have to bring her back for a second sleep study with CPAP, but will let him try an autoPAP machine at home, through a DME company (of her choice, or as per insurance requirement). The DME representative will educate her on how to use the machine, how to put the mask on, etc. I have placed an order in the chart. Please send referral, talk to patient, send report to referring MD. We will need a FU in sleep clinic for 10 weeks post-PAP set up, please arrange that with me or one of our NPs. Thanks,   Star Age, MD, PhD [facility])

## 2022-09-13 ENCOUNTER — Telehealth: Payer: Self-pay | Admitting: *Deleted

## 2022-09-13 NOTE — Telephone Encounter (Signed)
Pt gave [place] a call back. We discussed results. Pt is amenable to starting autopap and is aware of the insurance compliance requirements such as using machine at least 4 hours at night and being seen in the office 30-90 days after setup. Pt did not have preference for DME company. Will refer to [facility]. She will watch for a call for scheduling. I also moved her 2/20 (ok per pt) appt to 3/20 at 345 pm to give her time to get setup on machine. Pt's questions were answered. She was very Patent attorney.   Autopap referral faxed to [place]. Received a receipt of confirmation.

## 2022-09-13 NOTE — Telephone Encounter (Signed)
-----   Message from Star Age, MD sent at 09/07/2022  6:16 PM EST ----- Patient referred by Dr. Mellody Drown, seen by me on 05/23/22, diagnostic PSG on 08/22/22.    Please call and notify the patient that the recent sleep study did confirm the diagnosis of obstructive sleep apnea. OSA is overall mild, but worth treating to see if she feels better after treatment. To that end I recommend treatment for this in the form of autoPAP, which means, that we don't have to bring her back for a second sleep study with CPAP, but will let him try an autoPAP machine at home, through a DME company (of her choice, or as per insurance requirement). The DME representative will educate her on how to use the machine, how to put the mask on, etc. I have placed an order in the chart. Please send referral, talk to patient, send report to referring MD. We will need a FU in sleep clinic for 10 weeks post-PAP set up, please arrange that with me or one of our NPs. Thanks,   Star Age, MD, PhD [facility])

## 2022-09-13 NOTE — Telephone Encounter (Signed)
LMVM for pt to return call.  ( I did give her a little bit of the results in message, but wanted her to call back).

## 2022-10-25 ENCOUNTER — Ambulatory Visit: Payer: Medicare Other | Admitting: Neurology

## 2022-11-23 ENCOUNTER — Ambulatory Visit: Payer: Medicare Other | Admitting: Adult Health

## 2022-11-28 NOTE — Patient Instructions (Signed)

## 2022-11-28 NOTE — Progress Notes (Unsigned)
PATIENT: CECILLE FEHLMAN DOB: [birth year]  REASON FOR VISIT: follow up HISTORY FROM: patient  No chief complaint on file.    HISTORY OF PRESENT ILLNESS:  11/28/22 ALL:  MARGUERITTE UTTERBACK is a 67 y.o. female here today for follow up for OSA on CPAP.  She was seen in consult with Dr Rexene Alberts 05/2022 for snoring and daytime sleepiness. PSG showed mild obstructive sleep apnea, more pronounced during supine sleep with a total AHI of 8.3/h, O2 nadir 84%. AutoPAP advised.     HISTORY: (copied from Dr Guadelupe Sabin previous note)  Dear Dr. Mellody Drown,    I saw your patient, Emberleigh Thapa, upon your kind request, in my sleep clinic today for initial consultation of her sleep disorder, in particular, concern for underlying obstructive sleep apnea. The patient is unaccompanied today.  As you know, Ms. Baumann is a 67 year old right-handed woman with an underlying medical history of hypertension, hyperlipidemia, hypothyroidism, history of kidney stone, mood disorder, palpitations, left knee pain and mild obesity, who reports snoring and sleep disruption, nonrestorative sleep.  I reviewed your office note from 02/10/2022.  Her Epworth sleepiness score is 9 out of 24, fatigue severity score is 51 out of 63. Her snoring can be loud an disturbing, per friends' feedback.  She is widowed, husband died about 5 years ago.  Her 59 year old father lives with her and she also has custody of her 66-year-old step grandson.  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  She has no TV in her bedroom, her grandson sleeps in her bedroom on a [place] king-size bed.  They have no pets in the household.  She has been on Lamictal and Seroquel for years.  She is currently on Seroquel 300 mg at bedtime and Lamictal 200 mg at bedtime.  She has a history of bruxism and uses an occlusive guard per dentist.  She has had some weight gain in the past couple years in the realm of 15 pounds.  She  occasionally wakes up with a headache, does not take any medication for it.  She has nocturia about twice per average night, bedtime generally around 8:30 PM and rise time during the school year around 6 AM, sometimes she goes back to bed for a nap but not always.  She is a non-smoker and drinks very little caffeine, typically half a cup of coffee per day and very occasional to rare alcohol.  She has no known family history of sleep apnea.  Her father snores.   REVIEW OF SYSTEMS: Out of a complete 14 system review of symptoms, the patient complains only of the following symptoms, and all other reviewed systems are negative.  ESS:  ALLERGIES: Allergies  Allergen Reactions   Novocain [Procaine] Other (See Comments)    "Heart speeds up"    HOME MEDICATIONS: Outpatient Medications Prior to Visit  Medication Sig Dispense Refill   ALPRAZolam (XANAX) 1 MG tablet Take 0.5 mg by mouth daily.     Bempedoic Acid (NEXLETOL) 180 MG TABS Take 1 tablet by mouth daily at 6 (six) AM.     Flaxseed, Linseed, (FLAX SEEDS PO) Take 1 tablet by mouth daily.     IRON PO Take 1 tablet by mouth daily.     L-Methylfolate-B12-B6-B2 (CEREFOLIN) 02-03-49-5 MG TABS Take 1 tablet by mouth daily.     lamoTRIgine (LAMICTAL) 200 MG tablet Take 200 mg by mouth daily.     levothyroxine (  SYNTHROID) 112 MCG tablet Take 112 mcg by mouth daily before breakfast.     lisinopril (ZESTRIL) 10 MG tablet Take 10 mg by mouth daily.     Multiple Vitamins-Minerals (MULTIVITAMIN WITH MINERALS) tablet Take 1 tablet by mouth daily.     Omega-3 Fatty Acids (FISH OIL PO) Take 1 capsule by mouth daily.     QUEtiapine (SEROQUEL XR) 200 MG 24 hr tablet Take 200 mg by mouth at bedtime.     rosuvastatin (CRESTOR) 20 MG tablet Take 20 mg by mouth daily.     No facility-administered medications prior to visit.    PAST MEDICAL HISTORY: Past Medical History:  Diagnosis Date   Bipolar 1 disorder ([place])    Choroiditis    Hyperlipidemia     Hypertension    Hypothyroidism    Irregular heart beat    Kidney stone    Wisdom teeth extracted     PAST SURGICAL HISTORY: Past Surgical History:  Procedure Laterality Date   CYSTOSCOPY W/ URETERAL STENT PLACEMENT  05/18/2012   Procedure: CYSTOSCOPY WITH RETROGRADE PYELOGRAM/URETERAL STENT PLACEMENT;  Surgeon: Hanley Ben, MD;  Location: [facility];  Service: Urology;  Laterality: Left;   CYSTOSCOPY/RETROGRADE/URETEROSCOPY/STONE EXTRACTION WITH BASKET  05/18/2012   Procedure: CYSTOSCOPY/RETROGRADE/URETEROSCOPY/STONE EXTRACTION WITH BASKET;  Surgeon: Hanley Ben, MD;  Location: [facility];  Service: Urology;  Laterality: Left;   keratosis     10-2017 seborrheic    FAMILY HISTORY: Family History  Problem Relation Age of Onset   Dementia Father     SOCIAL HISTORY: Social History   Socioeconomic History   Marital status: Widowed    Spouse name: Not on file   Number of children: Not on file   Years of education: Not on file   Highest education level: Not on file  Occupational History   Not on file  Tobacco Use   Smoking status: Never   Smokeless tobacco: Never  Vaping Use   Vaping Use: Never used  Substance and Sexual Activity   Alcohol use: Yes    Comment: rare 1/2 glass in month red wine   Drug use: No   Sexual activity: Not on file  Other Topics Concern   Not on file  Social History Narrative   Caffiene 1/2 cup in am, sweet tea acc out , decaf at home.    Education:  [employer]   Takes care of grandson (and father for dementia).   Social Determinants of Health   Financial Resource Strain: Not on file  Food Insecurity: Not on file  Transportation Needs: Not on file  Physical Activity: Not on file  Stress: Not on file  Social Connections: Not on file  Intimate Partner Violence: Not on file     PHYSICAL EXAM  There were no vitals filed for this visit. There is no height or weight on file to calculate BMI.  Generalized: Well developed, in no acute  distress  Cardiology: normal rate and rhythm, no murmur noted Respiratory: clear to auscultation bilaterally  Neurological examination  Mentation: Alert oriented to time, place, history taking. Follows all commands speech and language fluent Cranial nerve II-XII: Pupils were equal round reactive to light. Extraocular movements were full, visual field were full  Motor: The motor testing reveals 5 over 5 strength of all 4 extremities. Good symmetric motor tone is noted throughout.  Gait and station: Gait is normal.    DIAGNOSTIC DATA (LABS, IMAGING, TESTING) - I reviewed patient records, labs, notes, testing and imaging myself where  available.      No data to display           Lab Results  Component Value Date   WBC 7.7 01/15/2014   HGB 12.6 01/15/2014   HCT 37.3 01/15/2014   MCV 92.3 01/15/2014   PLT 234 01/15/2014      Component Value Date/Time   NA 139 01/15/2014 1115   K 3.7 01/15/2014 1115   CL 99 01/15/2014 1115   CO2 24 01/15/2014 1115   GLUCOSE 127 (H) 01/15/2014 1115   BUN 11 01/15/2014 1115   CREATININE 0.72 01/15/2014 1115   CALCIUM 9.9 01/15/2014 1115   PROT 8.1 01/15/2014 1115   ALBUMIN 4.4 01/15/2014 1115   AST 32 01/15/2014 1115   ALT 24 01/15/2014 1115   ALKPHOS 92 01/15/2014 1115   BILITOT 0.9 01/15/2014 1115   GFRNONAA >90 01/15/2014 1115   GFRAA >90 01/15/2014 1115   No results found for: "CHOL", "HDL", "LDLCALC", "LDLDIRECT", "TRIG", "CHOLHDL" No results found for: "HGBA1C" No results found for: "VITAMINB12" No results found for: "TSH"   ASSESSMENT AND PLAN 67 y.o. year old female  has a past medical history of Bipolar 1 disorder ([place]), Choroiditis, Hyperlipidemia, Hypertension, Hypothyroidism, Irregular heart beat, Kidney stone, and Wisdom teeth extracted. here with   No diagnosis found.    ZERLINA FLAMER is doing well on CPAP therapy. Compliance report reveals ***. *** was encouraged to continue using CPAP nightly and for greater than 4  hours each night. We will update supply orders as indicated. Risks of untreated sleep apnea review and education materials provided. Healthy lifestyle habits encouraged. *** will follow up in ***, sooner if needed. *** verbalizes understanding and agreement with this plan.    No orders of the defined types were placed in this encounter.    No orders of the defined types were placed in this encounter.     Debbora Presto, FNP-C 11/28/2022, 4:29 PM [facility] [address] [place], [place] [address] [phone]

## 2022-11-29 ENCOUNTER — Ambulatory Visit: Payer: Medicare Other | Admitting: Family Medicine

## 2022-11-29 ENCOUNTER — Encounter: Payer: Self-pay | Admitting: Family Medicine

## 2022-11-29 VITALS — BP 135/88 | HR 85 | Ht 65.5 in | Wt 169.2 lb

## 2022-11-29 DIAGNOSIS — G4733 Obstructive sleep apnea (adult) (pediatric): Secondary | ICD-10-CM | POA: Diagnosis not present

## 2023-03-20 ENCOUNTER — Other Ambulatory Visit: Payer: Self-pay | Admitting: Internal Medicine

## 2023-03-20 ENCOUNTER — Ambulatory Visit: Admission: RE | Admit: 2023-03-20 | Payer: Medicare Other | Source: Ambulatory Visit

## 2023-03-20 DIAGNOSIS — M545 Low back pain, unspecified: Secondary | ICD-10-CM

## 2023-04-04 ENCOUNTER — Other Ambulatory Visit: Payer: Self-pay | Admitting: *Deleted

## 2023-04-04 DIAGNOSIS — M5416 Radiculopathy, lumbar region: Secondary | ICD-10-CM

## 2023-04-04 DIAGNOSIS — M5451 Vertebrogenic low back pain: Secondary | ICD-10-CM

## 2023-04-11 ENCOUNTER — Encounter: Payer: Self-pay | Admitting: *Deleted

## 2023-04-16 ENCOUNTER — Ambulatory Visit
Admission: RE | Admit: 2023-04-16 | Discharge: 2023-04-16 | Disposition: A | Discharge status: Home / Self Care | Payer: Medicare Other | Source: Ambulatory Visit | Attending: *Deleted | Admitting: *Deleted

## 2023-04-16 DIAGNOSIS — M5416 Radiculopathy, lumbar region: Secondary | ICD-10-CM

## 2023-04-16 DIAGNOSIS — M5451 Vertebrogenic low back pain: Secondary | ICD-10-CM

## 2023-04-27 ENCOUNTER — Other Ambulatory Visit: Payer: Self-pay | Admitting: Internal Medicine

## 2023-04-27 DIAGNOSIS — Z1231 Encounter for screening mammogram for malignant neoplasm of breast: Secondary | ICD-10-CM

## 2023-05-17 ENCOUNTER — Encounter: Payer: Self-pay | Admitting: Internal Medicine

## 2023-05-17 ENCOUNTER — Ambulatory Visit: Payer: Medicare Other

## 2023-05-18 ENCOUNTER — Encounter: Payer: Self-pay | Admitting: Internal Medicine

## 2023-05-18 DIAGNOSIS — E28319 Asymptomatic premature menopause: Secondary | ICD-10-CM

## 2023-06-06 ENCOUNTER — Ambulatory Visit
Admission: RE | Admit: 2023-06-06 | Discharge: 2023-06-06 | Disposition: A | Discharge status: Home / Self Care | Payer: Medicare Other | Source: Ambulatory Visit | Attending: Internal Medicine | Admitting: Internal Medicine

## 2023-06-06 DIAGNOSIS — Z1231 Encounter for screening mammogram for malignant neoplasm of breast: Secondary | ICD-10-CM

## 2023-11-27 NOTE — Progress Notes (Deleted)
 PATIENT: Miranda Rollins DOB: [birth year]  REASON FOR VISIT: follow up HISTORY FROM: patient  No chief complaint on file.    HISTORY OF PRESENT ILLNESS:  11/27/23 ALL:  Miranda Rollins for follow up for OSA on CPAP.     11/29/2022 ALL:  Miranda Rollins is a 68 y.o. female here today for follow up for OSA on CPAP.  She was seen in consult with Dr Frances Furbish 05/2022 for snoring and daytime sleepiness. PSG showed mild obstructive sleep apnea, more pronounced during supine sleep with a total AHI of 8.3/h, O2 nadir 84%. AutoPAP advised. She reports doing well on CPAP therapy. She is using therapy most every night for about 6 hours. She is adjusting to sleeping with the tubing and adjusting mask/headgear.     HISTORY: (copied from Dr Teofilo Pod previous note)  Dear Dr. Ludwig Rollins,    I saw your patient, Miranda Rollins, upon your kind request, in my sleep clinic today for initial consultation of her sleep disorder, in particular, concern for underlying obstructive sleep apnea. The patient is unaccompanied today.  As you know, Miranda Rollins is a 68 year old right-handed woman with an underlying medical history of hypertension, hyperlipidemia, hypothyroidism, history of kidney stone, mood disorder, palpitations, left knee pain and mild obesity, who reports snoring and sleep disruption, nonrestorative sleep.  I reviewed your office note from 02/10/2022.  Her Epworth sleepiness score is 9 out of 24, fatigue severity score is 51 out of 63. Her snoring can be loud an disturbing, per friends' feedback.  She is widowed, husband died about 5 years ago.  Her 65 year old father lives with her and she also has custody of her 43-year-old step grandson.  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  She has no TV in her bedroom, her grandson sleeps in her bedroom on a [place] king-size bed.  They have no pets in the household.  She has been on Lamictal and Seroquel for years.   She is currently on Seroquel 300 mg at bedtime and Lamictal 200 mg at bedtime.  She has a history of bruxism and uses an occlusive guard per dentist.  She has had some weight gain in the past couple years in the realm of 15 pounds.  She occasionally wakes up with a headache, does not take any medication for it.  She has nocturia about twice per average night, bedtime generally around 8:30 PM and rise time during the school year around 6 AM, sometimes she goes back to bed for a nap but not always.  She is a non-smoker and drinks very little caffeine, typically half a cup of coffee per day and very occasional to rare alcohol.  She has no known family history of sleep apnea.  Her father snores.   REVIEW OF SYSTEMS: Out of a complete 14 system review of symptoms, the patient complains only of the following symptoms, none and all other reviewed systems are negative.    ALLERGIES: Allergies  Allergen Reactions   Novocain [Procaine] Other (See Comments)    "Heart speeds up"    HOME MEDICATIONS: Outpatient Medications Prior to Visit  Medication Sig Dispense Refill   ALPRAZolam (XANAX) 1 MG tablet Take 0.5 mg by mouth daily.     Bempedoic Acid (NEXLETOL) 180 MG TABS Take 1 tablet by mouth daily at 6 (six) AM. (Patient not taking: Reported on 11/29/2022)     Flaxseed, Linseed, (FLAX SEEDS PO) Take  1 tablet by mouth daily. (Patient not taking: Reported on 11/29/2022)     IRON PO Take 1 tablet by mouth daily. (Patient not taking: Reported on 11/29/2022)     L-Methylfolate-B12-B6-B2 (CEREFOLIN) 02-03-49-5 MG TABS Take 1 tablet by mouth daily. (Patient not taking: Reported on 11/29/2022)     lamoTRIgine (LAMICTAL) 200 MG tablet Take 200 mg by mouth daily.     levothyroxine (SYNTHROID) 112 MCG tablet Take 112 mcg by mouth daily before breakfast.     lisinopril (ZESTRIL) 10 MG tablet Take 10 mg by mouth daily.     Multiple Vitamins-Minerals (MULTIVITAMIN WITH MINERALS) tablet Take 1 tablet by mouth daily.      Omega-3 Fatty Acids (FISH OIL PO) Take 1 capsule by mouth daily.     QUEtiapine (SEROQUEL XR) 200 MG 24 hr tablet Take 200 mg by mouth at bedtime.     rosuvastatin (CRESTOR) 20 MG tablet Take 20 mg by mouth daily.     No facility-administered medications prior to visit.    PAST MEDICAL HISTORY: Past Medical History:  Diagnosis Date   Bipolar 1 disorder ([facility])    Choroiditis    Hyperlipidemia    Hypertension    Hypothyroidism    Irregular heart beat    Kidney stone    Wisdom teeth extracted     PAST SURGICAL HISTORY: Past Surgical History:  Procedure Laterality Date   CYSTOSCOPY W/ URETERAL STENT PLACEMENT  05/18/2012   Procedure: CYSTOSCOPY WITH RETROGRADE PYELOGRAM/URETERAL STENT PLACEMENT;  Surgeon: Lindaann Slough, MD;  Location: [facility];  Service: Urology;  Laterality: Left;   CYSTOSCOPY/RETROGRADE/URETEROSCOPY/STONE EXTRACTION WITH BASKET  05/18/2012   Procedure: CYSTOSCOPY/RETROGRADE/URETEROSCOPY/STONE EXTRACTION WITH BASKET;  Surgeon: Lindaann Slough, MD;  Location: [facility];  Service: Urology;  Laterality: Left;   keratosis     10-2017 seborrheic    FAMILY HISTORY: Family History  Problem Relation Age of Onset   Dementia Father     SOCIAL HISTORY: Social History   Socioeconomic History   Marital status: Widowed    Spouse name: Not on file   Number of children: Not on file   Years of education: Not on file   Highest education level: Not on file  Occupational History   Not on file  Tobacco Use   Smoking status: Never   Smokeless tobacco: Never  Vaping Use   Vaping status: Never Used  Substance and Sexual Activity   Alcohol use: Yes    Comment: rare 1/2 glass in month red wine   Drug use: No   Sexual activity: Not on file  Other Topics Concern   Not on file  Social History Narrative   Caffiene 1/2 cup in am, sweet tea acc out , decaf at home.    Education:  [employer]   Takes care of grandson (and father for dementia).   Social Drivers of Manufacturing engineer Strain: Not on file  Food Insecurity: Not on file  Transportation Needs: Not on file  Physical Activity: Not on file  Stress: Not on file  Social Connections: Unknown (05/31/2023)   Received from [facility]   Social Network    Social Network: Not on file  Intimate Partner Violence: Unknown (05/31/2023)   Received from [facility]   HITS    Physically Hurt: Not on file    Insult or Talk Down To: Not on file    Threaten Physical Harm: Not on file    Scream or Curse: Not on file  PHYSICAL EXAM  There were no vitals filed for this visit.  There is no height or weight on file to calculate BMI.  Generalized: Well developed, in no acute distress  Cardiology: normal rate and rhythm, no murmur noted Respiratory: clear to auscultation bilaterally  Neurological examination  Mentation: Alert oriented to time, place, history taking. Follows all commands speech and language fluent Cranial nerve II-XII: Pupils were equal round reactive to light. Extraocular movements were full, visual field were full  Motor: The motor testing reveals 5 over 5 strength of all 4 extremities. Good symmetric motor tone is noted throughout.  Gait and station: Gait is normal.    DIAGNOSTIC DATA (LABS, IMAGING, TESTING) - I reviewed patient records, labs, notes, testing and imaging myself where available.      No data to display           Lab Results  Component Value Date   WBC 7.7 01/15/2014   HGB 12.6 01/15/2014   HCT 37.3 01/15/2014   MCV 92.3 01/15/2014   PLT 234 01/15/2014      Component Value Date/Time   NA 139 01/15/2014 1115   K 3.7 01/15/2014 1115   CL 99 01/15/2014 1115   CO2 24 01/15/2014 1115   GLUCOSE 127 (H) 01/15/2014 1115   BUN 11 01/15/2014 1115   CREATININE 0.72 01/15/2014 1115   CALCIUM 9.9 01/15/2014 1115   PROT 8.1 01/15/2014 1115   ALBUMIN 4.4 01/15/2014 1115   AST 32 01/15/2014 1115   ALT 24 01/15/2014 1115   ALKPHOS 92 01/15/2014 1115    BILITOT 0.9 01/15/2014 1115   GFRNONAA >90 01/15/2014 1115   GFRAA >90 01/15/2014 1115   No results found for: "CHOL", "HDL", "LDLCALC", "LDLDIRECT", "TRIG", "CHOLHDL" No results found for: "HGBA1C" No results found for: "VITAMINB12" No results found for: "TSH"   ASSESSMENT AND PLAN 68 y.o. year old female  has a past medical history of Bipolar 1 disorder ([facility]), Choroiditis, Hyperlipidemia, Hypertension, Hypothyroidism, Irregular heart beat, Kidney stone, and Wisdom teeth extracted. here with   No diagnosis found.     ZIANA HEYLIGER is doing well on CPAP therapy. Compliance report reveals excellent compliance.She was encouraged to continue using CPAP nightly and for greater than 4 hours each night. We will update supply orders as indicated. Risks of untreated sleep apnea review and education materials provided. Healthy lifestyle habits encouraged. She will follow up in 1 year, sooner if needed. She verbalizes understanding and agreement with this plan.    No orders of the defined types were placed in this encounter.    No orders of the defined types were placed in this encounter.     Shawnie Dapper, FNP-C 11/27/2023, 9:48 AM [facility] [address] [place], [place] [address] [phone]

## 2023-11-28 NOTE — Progress Notes (Deleted)
 Marland Kitchen

## 2023-11-29 ENCOUNTER — Encounter: Payer: Self-pay | Admitting: Family Medicine

## 2023-11-29 ENCOUNTER — Ambulatory Visit: Payer: Medicare Other | Admitting: Family Medicine

## 2023-11-29 DIAGNOSIS — G4733 Obstructive sleep apnea (adult) (pediatric): Secondary | ICD-10-CM

## 2023-12-07 ENCOUNTER — Telehealth: Payer: Self-pay

## 2023-12-07 NOTE — Telephone Encounter (Signed)
 Patient lvm on sleep lab phone with questions about a missed appointment letter she received. CB # [phone]

## 2023-12-07 NOTE — Telephone Encounter (Signed)
 I called and left detailed message on cell per DPR access. Pt no showed for visit on 11/2023. Was told to return in 1 year for follow up visit. I asked to schedule a follow up with office.  I explained the letter was to let her know she no showed.

## 2024-02-27 ENCOUNTER — Encounter: Payer: Self-pay | Admitting: *Deleted

## 2024-03-28 ENCOUNTER — Other Ambulatory Visit: Payer: Self-pay | Admitting: Internal Medicine

## 2024-03-28 DIAGNOSIS — Z1231 Encounter for screening mammogram for malignant neoplasm of breast: Secondary | ICD-10-CM

## 2024-04-24 ENCOUNTER — Ambulatory Visit: Admitting: Podiatry

## 2024-04-26 ENCOUNTER — Ambulatory Visit (INDEPENDENT_AMBULATORY_CARE_PROVIDER_SITE_OTHER): Admitting: Podiatry

## 2024-04-26 DIAGNOSIS — M7752 Other enthesopathy of left foot: Secondary | ICD-10-CM | POA: Diagnosis not present

## 2024-04-26 NOTE — Progress Notes (Signed)
 Subjective:  Patient ID: Miranda Rollins, female    DOB: Jul 08, 1956,  MRN: [id number]  Chief Complaint  Patient presents with   Foot Pain    68 y.o. female presents with the above complaint.  Patient presents with left third metatarsophalangeal joint capsulitis worse with ambulation or shoe pressure patient would like to discuss treatment options for this.  She has not seen Novo-Spirozine me pain scale 7 out of 10 dull aching nature hurts with ambulation hurts when going barefooted.  She has not tried anything else for it.  She wears shoes in the house and it feels much better.   Review of Systems: Negative except as noted in the HPI. Denies N/V/F/Ch.  Past Medical History:  Diagnosis Date   Bipolar 1 disorder ([facility])    Choroiditis    Hyperlipidemia    Hypertension    Hypothyroidism    Irregular heart beat    Kidney stone    Wisdom teeth extracted     Current Outpatient Medications:    ALPRAZolam (XANAX) 1 MG tablet, Take 0.5 mg by mouth daily., Disp: , Rfl:    Bempedoic Acid (NEXLETOL) 180 MG TABS, Take 1 tablet by mouth daily at 6 (six) AM. (Patient not taking: Reported on 11/29/2022), Disp: , Rfl:    Flaxseed, Linseed, (FLAX SEEDS PO), Take 1 tablet by mouth daily. (Patient not taking: Reported on 11/29/2022), Disp: , Rfl:    IRON PO, Take 1 tablet by mouth daily. (Patient not taking: Reported on 11/29/2022), Disp: , Rfl:    L-Methylfolate-B12-B6-B2 (CEREFOLIN) 02-03-49-5 MG TABS, Take 1 tablet by mouth daily. (Patient not taking: Reported on 11/29/2022), Disp: , Rfl:    lamoTRIgine (LAMICTAL) 200 MG tablet, Take 200 mg by mouth daily., Disp: , Rfl:    levothyroxine (SYNTHROID) 112 MCG tablet, Take 112 mcg by mouth daily before breakfast., Disp: , Rfl:    lisinopril (ZESTRIL) 10 MG tablet, Take 10 mg by mouth daily., Disp: , Rfl:    Multiple Vitamins-Minerals (MULTIVITAMIN WITH MINERALS) tablet, Take 1 tablet by mouth daily., Disp: , Rfl:    Omega-3 Fatty Acids (FISH OIL PO), Take 1  capsule by mouth daily., Disp: , Rfl:    QUEtiapine (SEROQUEL XR) 200 MG 24 hr tablet, Take 200 mg by mouth at bedtime., Disp: , Rfl:    rosuvastatin (CRESTOR) 20 MG tablet, Take 20 mg by mouth daily., Disp: , Rfl:   Social History   Tobacco Use  Smoking Status Never  Smokeless Tobacco Never    Allergies  Allergen Reactions   Novocain [Procaine] Other (See Comments)    Heart speeds up   Objective:  There were no vitals filed for this visit. There is no height or weight on file to calculate BMI. Constitutional Well developed. Well nourished.  Vascular Dorsalis pedis pulses palpable bilaterally. Posterior tibial pulses palpable bilaterally. Capillary refill normal to all digits.  No cyanosis or clubbing noted. Pedal hair growth normal.  Neurologic Normal speech. Oriented to person, place, and time. Epicritic sensation to light touch grossly present bilaterally.  Dermatologic Nails well groomed and normal in appearance. No open wounds. No skin lesions.  Orthopedic: Pain on palpation left third metatarsophalangeal joint pain with range of motion of the Deep intra-articular third MPJ pain noted.  No crepitus noted.   Radiographs: None Assessment:   1. Capsulitis of metatarsophalangeal (MTP) joint of left foot    Plan:  Patient was evaluated and treated and all questions answered.  Left third metatarsophalangeal joint capsulitis -  All questions and concerns were discussed with the patient extensive due to given the amount of pain that she is having should benefit from steroid injection because confirmed open with usual pain.  Patient agrees with plan to proceed with steroid injection -A steroid injection was performed at left third MTP using 1% plain Lidocaine  and 10 mg of Kenalog. This was well tolerated. Left third MTP  No follow-ups on file.

## 2024-05-01 ENCOUNTER — Other Ambulatory Visit (HOSPITAL_BASED_OUTPATIENT_CLINIC_OR_DEPARTMENT_OTHER): Payer: Self-pay | Admitting: Obstetrics

## 2024-05-01 DIAGNOSIS — M8580 Other specified disorders of bone density and structure, unspecified site: Secondary | ICD-10-CM

## 2024-05-14 ENCOUNTER — Ambulatory Visit (INDEPENDENT_AMBULATORY_CARE_PROVIDER_SITE_OTHER)

## 2024-05-14 ENCOUNTER — Encounter: Payer: Self-pay | Admitting: Podiatry

## 2024-05-14 ENCOUNTER — Ambulatory Visit (INDEPENDENT_AMBULATORY_CARE_PROVIDER_SITE_OTHER): Admitting: Podiatry

## 2024-05-14 DIAGNOSIS — M7742 Metatarsalgia, left foot: Secondary | ICD-10-CM

## 2024-05-14 DIAGNOSIS — M7752 Other enthesopathy of left foot: Secondary | ICD-10-CM

## 2024-05-14 MED ORDER — MELOXICAM 7.5 MG PO TABS: 7.5000 mg | ORAL_TABLET | Freq: Every day | ORAL | 0 refills | Status: AC

## 2024-05-14 NOTE — Patient Instructions (Signed)
 Meloxicam  Tablets What is this medication? MELOXICAM  (mel OX i cam) treats mild to moderate pain, inflammation, or arthritis. It works by decreasing inflammation. It belongs to a group of medications called NSAIDs. This medicine may be used for other purposes; ask your health care provider or pharmacist if you have questions. COMMON BRAND NAME(S): Mobic  What should I tell my care team before I take this medication? They need to know if you have any of these conditions: Asthma Bleeding problems Dehydration Frequently drink alcohol Have had a heart attack, stroke, or mini-stroke Heart bypass surgery, or CABG, within the past 2 weeks Heart or blood vessel conditions Heart failure High blood pressure Kidney disease Liver disease Stomach bleeding Stomach ulcers, other stomach or intestine problems Tobacco use An unusual or allergic reaction to meloxicam , other medications, foods, dyes, or preservatives Pregnant or trying to get pregnant Breastfeeding How should I use this medication? Take this medication by mouth. Take it as directed on the prescription label at the same time every day. You can take it with or without food. If it upsets your stomach, take it with food. Do not use it more often than directed. There may be unused or extra doses in the bottle after you finish your treatment. Talk to your care team if you have questions about your dose. A special MedGuide will be given to you by the pharmacist with each prescription and refill. Be sure to read this information carefully each time. Talk to your care team about the use of this medication in children. Special care may be needed. People over 68 years of age may have a stronger reaction and need a smaller dose. Overdosage: If you think you have taken too much of this medicine contact a poison control center or emergency room at once. NOTE: This medicine is only for you. Do not share this medicine with others. What if I miss a  dose? If you miss a dose, take it as soon as you can. If it is almost time for your next dose, take only that dose. Do not take double or extra doses. What may interact with this medication? Do not take this medication with any of the following: Cidofovir Ketorolac This medication may also interact with the following: Alcohol Aspirin and aspirin-like medications Blood thinners Cyclosporine Digoxin Diuretics Lithium Medications for high blood pressure Methotrexate Other NSAIDs, medications for pain and inflammation, such as ibuprofen or naproxen Some medications for depression Steroid medications, such as prednisone or cortisone Supplements, such as garlic, ginger, ginkgo, methylsulfonylmethane (MSM) This list may not describe all possible interactions. Give your health care provider a list of all the medicines, herbs, non-prescription drugs, or dietary supplements you use. Also tell them if you smoke, drink alcohol, or use illegal drugs. Some items may interact with your medicine. What should I watch for while using this medication? Visit your care team for regular checks on your progress. Tell your care team if your symptoms do not start to get better or if they get worse. Do not take aspirin or other NSAIDs, such as ibuprofen or naproxen, while you are taking this medication. Side effects, such as upset stomach, nausea, and ulcers, may be more likely to occur. Many over-the-counter medications contain aspirin, ibuprofen, or naproxen. It is important to read labels carefully. Talk to your care team about all the medications you take. They can tell you what is safe to take together. This medication can cause serious bleeding, ulcers, or tears in the stomach. These problems  can occur at any time and with no warning signs. They are more common with long-term use. Talk to your care team right away if you have stomach pain, bloody or black, tar-like stools, or vomit blood that is red or looks  like coffee grounds. This medication increases the risk of blood clots, heart attack, and stroke. These events can occur at any time. They are more common with long-term use and in those who have heart disease. If you take aspirin to prevent a heart attack or stroke, talk to your care team. They can help you find an option that works for you. This medication may cause serious skin reactions. They can happen weeks to months after starting the medication. Talk to your care team right away if you have fevers or flu-like symptoms with a rash. The rash may be red or purple and then turn into blisters or peeling of the skin. Or you might notice a red rash with swelling of the face, lips, or lymph nodes in your neck or under your arms. Talk to your care team if you may be pregnant. Taking this medication after 20 weeks of pregnancy may cause serious birth defects. Use of this medication after 30 weeks of pregnancy is not recommended. This medication may cause infertility. It is usually temporary. Talk to your care team if you are concerned about your fertility. What side effects may I notice from receiving this medication? Side effects that you should report to your care team as soon as possible: Allergic reactions--skin rash, itching, hives, swelling of the face, lips, tongue, or throat Bleeding--bloody or black, tar-like stools, vomiting blood or brown material that looks like coffee grounds, red or dark brown urine, small red or purple spots on skin, unusual bruising or bleeding Heart attack--pain or tightness in the chest, shoulders, arms, or jaw, nausea, shortness of breath, cold or clammy skin, feeling faint or lightheaded Heart failure--shortness of breath, swelling of the ankles, feet, or hands, sudden weight gain, unusual weakness or fatigue Increase in blood pressure Kidney injury--decrease in the amount of urine, swelling of the ankles, hands, or feet Liver injury--right upper belly pain, loss of  appetite, nausea, light-colored stool, dark yellow or brown urine, yellowing skin or eyes, unusual weakness or fatigue Rash, fever, and swollen lymph nodes Redness, blistering, peeling, or loosening of the skin, including inside the mouth Round red or dark patches on the skin that may itch, burn, and blister Stroke--sudden numbness or weakness of the face, arm, or leg, trouble speaking, confusion, trouble walking, loss of balance or coordination, dizziness, severe headache, change in vision Side effects that usually do not require medical attention (report these to your care team if they continue or are bothersome): Headache Loss of appetite Nausea Upset stomach This list may not describe all possible side effects. Call your doctor for medical advice about side effects. You may report side effects to FDA at 1-800-FDA-1088. Where should I keep my medication? Keep out of the reach of children and pets. Store at room temperature between 20 and 25 degrees C (68 and 77 degrees F). Protect from moisture. Keep the container tightly closed. Get rid of any unused medication after the expiration date. To get rid of medications that are no longer needed or have expired: Take the medication to a medication take-back program. Check with your pharmacy or law enforcement to find a location. If you cannot return the medication, check the label or package insert to see if the medication should be  thrown out in the garbage or flushed down the toilet. If you are not sure, ask your care team. If it is safe to put it in the trash, empty the medication out of the container. Mix the medication with cat litter, dirt, coffee grounds, or other unwanted substance. Seal the mixture in a bag or container. Put it in the trash. NOTE: This sheet is a summary. It may not cover all possible information. If you have questions about this medicine, talk to your doctor, pharmacist, or health care provider.  2025 Elsevier/Gold  Standard (2023-10-31 00:00:00)

## 2024-05-14 NOTE — Progress Notes (Signed)
  Subjective:  Patient ID: Miranda Rollins, female    DOB: 11-10-1955,  MRN: [id number]  Chief Complaint  Patient presents with   Foot Pain    Rm11 Pain in left foot for 4 days after injection a few weeks ago.    Discussed the use of AI scribe software for clinical note transcription with the patient, who gave verbal consent to proceed.  History of Present Illness Miranda Rollins is a 68 year old female who presents with left foot spasms and pain.  She has experienced spasms and cramping in her left foot for four days, affecting her toes and the ball of her foot. The pain was severe enough to require ibuprofen, which she typically avoids. On the previous day, the pain impaired her ability to walk. The pain is described as burning during cramps. There is no recent injury, swelling, numbness, or tingling.  She received an injection a couple of weeks ago, which provided temporary relief. She has a high arch and tends to lean outward on her foot when walking. She has tight calf muscles she notes.  She uses ibuprofen for pain management and yoga techniques, such as rolling tennis balls under her foot, to alleviate symptoms. There is no pain in the ankle, Achilles, top of the foot, or arch.      Objective:  There were no vitals filed for this visit.  Physical Exam General: AAO x3, NAD  Dermatological: Skin is warm, dry and supple bilateral.  There are no open sores, no preulcerative lesions, no rash or signs of infection present.  Vascular: Dorsalis Pedis artery and Posterior Tibial artery pedal pulses are 2/4 bilateral with immedate capillary fill time.There is no pain with calf compression, swelling, warmth, erythema.   Neruologic: Grossly intact via light touch bilateral.  Musculoskeletal: Cavus foot structure present.  Tenderness is most of the left third interspace.  She has tenderness submetatarsal area, able to appreciate any area of pinpoint tenderness.  No edema to the forefoot  there is no ecchymosis.  Dorsal spurring present of the first metatarsal cuneiform joint.  No other areas discomfort.  No pain to the ankle joint.  MMT 5/5.  No pain with vibratory sensation on the metatarsals where she has her symptoms.  Gait: Unassisted, Nonantalgic.     No images are attached to the encounter.    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   Assessment:   1. Capsulitis of metatarsophalangeal (MTP) joint of left foot   2. Metatarsalgia, left foot      Plan:  Patient was evaluated and treated and all questions answered.  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 Pain due to inflammation from pressure distribution and loss of fat pad cushioning. - Add metatarsal support to left shoe to redistribute pressure. - Consider custom orthotic inserts if symptoms persist.  I will have her follow-up with Sueanne, pedorthist for this. - Prescribe meloxicam  as needed for inflammation. - Advise icing if sore. - Recommend toe exercises: towel scrunches, marble pickups. - Schedule orthotic fitting with Trish. - Provide additional gel pads.   Return for orthotics with Lolita .   Donnice JONELLE Fees DPM

## 2024-06-03 ENCOUNTER — Other Ambulatory Visit

## 2024-06-06 ENCOUNTER — Ambulatory Visit

## 2024-06-11 ENCOUNTER — Ambulatory Visit
Admission: RE | Admit: 2024-06-11 | Discharge: 2024-06-11 | Disposition: A | Discharge status: Home / Self Care | Source: Ambulatory Visit | Attending: Internal Medicine | Admitting: Internal Medicine

## 2024-06-11 DIAGNOSIS — Z1231 Encounter for screening mammogram for malignant neoplasm of breast: Secondary | ICD-10-CM

## 2024-06-14 ENCOUNTER — Ambulatory Visit (INDEPENDENT_AMBULATORY_CARE_PROVIDER_SITE_OTHER)

## 2024-06-14 DIAGNOSIS — M2142 Flat foot [pes planus] (acquired), left foot: Secondary | ICD-10-CM | POA: Diagnosis not present

## 2024-06-14 DIAGNOSIS — M2141 Flat foot [pes planus] (acquired), right foot: Secondary | ICD-10-CM

## 2024-06-14 DIAGNOSIS — M7752 Other enthesopathy of left foot: Secondary | ICD-10-CM

## 2024-06-14 DIAGNOSIS — M7742 Metatarsalgia, left foot: Secondary | ICD-10-CM

## 2024-06-14 NOTE — Progress Notes (Signed)
 Patient decided on powersteps for now due to oop costs  Patient states these fel good and if she wants custom will call back and schedule

## 2024-07-18 ENCOUNTER — Encounter: Payer: Self-pay | Admitting: Neurology

## 2024-07-18 ENCOUNTER — Ambulatory Visit: Admitting: Neurology

## 2024-07-18 VITALS — BP 134/79 | HR 82 | Ht 65.0 in | Wt 170.0 lb

## 2024-07-18 DIAGNOSIS — G4733 Obstructive sleep apnea (adult) (pediatric): Secondary | ICD-10-CM | POA: Diagnosis not present

## 2024-07-18 NOTE — Progress Notes (Signed)
 Subjective:    Patient ID: Miranda Rollins is a 68 y.o. female.  HPI   Interim history:   Miranda Rollins is a 68 year old right-handed woman with an underlying medical history of hypertension, hyperlipidemia, hypothyroidism, history of kidney stone, mood disorder, palpitations, left knee pain and mild obesity, who presents for follow-up consultation of her obstructive sleep apnea on PAP therapy.  The patient is unaccompanied today.  She was last seen in our sleep clinic by Miranda Forbes, NP in March 2024, at which time she was compliant with her AutoPap.  She missed an interim appointment in March 2025.  Today, 07/18/2024: I reviewed her AutoPap compliance data for the past 90 days.  She has not used her AutoPap since about mid August 2025.  She reports that she has not been using her machine.  She recently received new supplies and the hose fell in dirty water and while she cleaned it, she used Clorox as well, she did not feel like using the hose.  She has not been in touch with her DME company regarding getting a replacement.  She uses a fullface mask.  Previously:   11/29/2022 Miranda Forbes, NP): <<Miranda Rollins is a 68 y.o. female here today for follow up for OSA on CPAP.  She was seen in consult with Dr Buck 05/2022 for snoring and daytime sleepiness. PSG showed mild obstructive sleep apnea, more pronounced during supine sleep with a total AHI of 8.3/h, O2 nadir 84%. AutoPAP advised. She reports doing well on CPAP therapy. She is using therapy most every night for about 6 hours. She is adjusting to sleeping with the tubing and adjusting mask/headgear. >> 05/23/2022: (She) reports snoring and sleep disruption, nonrestorative sleep.  I reviewed your office note from 02/10/2022.  Her Epworth sleepiness score is 9 out of 24, fatigue severity score is 51 out of 63. Her snoring can be loud an disturbing, per friends' feedback.  She is widowed, husband died about 5 years ago.  Her 23 year old father lives with her and  she also has custody of her 14-year-old step grandson.  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  She has no TV in her bedroom, her grandson sleeps in her bedroom on a [place]  king-size bed.  They have no pets in the household.  She has been on Lamictal and Seroquel for years.  She is currently on Seroquel 300 mg at bedtime and Lamictal 200 mg at bedtime.  She has a history of bruxism and uses an occlusive guard per dentist.  She has had some weight gain in the past couple years in the realm of 15 pounds.  She occasionally wakes up with a headache, does not take any medication for it.  She has nocturia about twice per average night, bedtime generally around 8:30 PM and rise time during the school year around 6 AM, sometimes she goes back to bed for a nap but not always.  She is a non-smoker and drinks very little caffeine, typically half a cup of coffee per day and very occasional to rare alcohol.  She has no known family history of sleep apnea.  Her father snores.    The patient's allergies, current medications, family history, past medical history, past social history, past surgical history and problem list were reviewed and updated as appropriate.    Her Past Medical History Is Significant For: Past Medical History:  Diagnosis Date   Bipolar 1  disorder (HCC)    Choroiditis    Hyperlipidemia    Hypertension    Hypothyroidism    Irregular heart beat    Kidney stone    Wisdom teeth extracted     Her Past Surgical History Is Significant For: Past Surgical History:  Procedure Laterality Date   CYSTOSCOPY W/ URETERAL STENT PLACEMENT  05/18/2012   Procedure: CYSTOSCOPY WITH RETROGRADE PYELOGRAM/URETERAL STENT PLACEMENT;  Surgeon: Thomasine Oiler, MD;  Location: [facility];  Service: Urology;  Laterality: Left;   CYSTOSCOPY/RETROGRADE/URETEROSCOPY/STONE EXTRACTION WITH BASKET  05/18/2012   Procedure:  CYSTOSCOPY/RETROGRADE/URETEROSCOPY/STONE EXTRACTION WITH BASKET;  Surgeon: Thomasine Oiler, MD;  Location: [facility];  Service: Urology;  Laterality: Left;   keratosis     10-2017 seborrheic    Her Family History Is Significant For: Family History  Problem Relation Age of Onset   Dementia Father    Breast cancer Neg Hx     Her Social History Is Significant For: Social History   Socioeconomic History   Marital status: Widowed    Spouse name: Not on file   Number of children: Not on file   Years of education: Not on file   Highest education level: Not on file  Occupational History   Not on file  Tobacco Use   Smoking status: Never   Smokeless tobacco: Never  Vaping Use   Vaping status: Never Used  Substance and Sexual Activity   Alcohol use: Yes    Comment: rare 1/2 glass in month red wine   Drug use: No   Sexual activity: Not on file  Other Topics Concern   Not on file  Social History Narrative   Caffiene 1/2 cup in am, sweet tea acc out , decaf at home.    Education:  [employer]   Takes care of grandson (and father for dementia).   Social Drivers of Corporate Investment Banker Strain: Not on file  Food Insecurity: Not on file  Transportation Needs: Not on file  Physical Activity: Not on file  Stress: Not on file  Social Connections: Unknown (05/31/2023)   Received from [facility]   Social Network    Social Network: Not on file    Her Allergies Are:  Allergies  Allergen Reactions   Novocain [Procaine] Other (See Comments)    Heart speeds up  :   Her Current Medications Are:  Outpatient Encounter Medications as of 07/18/2024  Medication Sig   ALPRAZolam (XANAX) 1 MG tablet Take 0.5 mg by mouth daily.   lamoTRIgine (LAMICTAL) 200 MG tablet Take 200 mg by mouth daily.   levothyroxine (SYNTHROID) 112 MCG tablet Take 112 mcg by mouth daily before breakfast.   lisinopril (ZESTRIL) 10 MG tablet Take 10 mg by mouth daily.   meloxicam  (MOBIC ) 7.5 MG tablet  Take 1 tablet (7.5 mg total) by mouth daily.   Multiple Vitamins-Minerals (MULTIVITAMIN WITH MINERALS) tablet Take 1 tablet by mouth daily.   QUEtiapine (SEROQUEL XR) 200 MG 24 hr tablet Take 200 mg by mouth at bedtime.   rosuvastatin (CRESTOR) 20 MG tablet Take 20 mg by mouth daily.   Bempedoic Acid (NEXLETOL) 180 MG TABS Take 1 tablet by mouth daily at 6 (six) AM. (Patient not taking: Reported on 05/14/2024)   Flaxseed, Linseed, (FLAX SEEDS PO) Take 1 tablet by mouth daily. (Patient not taking: Reported on 05/14/2024)   IRON PO Take 1 tablet by mouth daily. (Patient not taking: Reported on 05/14/2024)   L-Methylfolate-B12-B6-B2 (CEREFOLIN) 02-03-49-5 MG TABS Take  1 tablet by mouth daily. (Patient not taking: Reported on 05/14/2024)   Omega-3 Fatty Acids (FISH OIL PO) Take 1 capsule by mouth daily.   No facility-administered encounter medications on file as of 07/18/2024.  :  Review of Systems:  Out of a complete 14 point review of systems, all are reviewed and negative with the exception of these symptoms as listed below:  Review of Systems  Objective:  Neurological Exam  Physical Exam Physical Examination:   Vitals:   07/18/24 0952  BP: 134/79  Pulse: 82    General Examination: The patient is a very pleasant 68 y.o. female in no acute distress. She appears well-developed and well-nourished and well groomed.   HEENT: Normocephalic, atraumatic, pupils are equal, round and reactive to light, extraocular tracking is well-preserved, corrective eyeglasses in place.  Hearing grossly intact.  Face is symmetric with normal facial animation, airway examination reveals moderate mouth dryness, otherwise stable findings.  Tongue protrudes centrally and palate elevates symmetrically.    Chest: Clear to auscultation without wheezing, rhonchi or crackles noted.   Heart: S1+S2+0, regular and normal without murmurs, rubs or gallops noted.    Abdomen: non-distended.   Extremities: There is no obvious  swelling in the distal lower extremities bilaterally.    Skin: Warm and dry without trophic changes noted.    Musculoskeletal: exam reveals no obvious joint deformities.     Neurologically:  Mental status: The patient is awake, alert and oriented in all 4 spheres. Her immediate and remote memory, attention, language skills and fund of knowledge are appropriate. There is no evidence of aphasia, agnosia, apraxia or anomia. Speech is clear with normal prosody and enunciation. Thought process is linear. Mood is normal and affect is normal.  Cranial nerves II - XII are as described above under HEENT exam.  Motor exam: Normal bulk, moving all 4 extremities without restriction, no obvious resting or action tremor.    Fine motor skills and coordination: grossly intact.  Cerebellar testing: No dysmetria or intention tremor. There is no truncal or gait ataxia.   Sensory exam: intact to light touch in the upper and lower extremities.  Gait, station and balance: She stands easily. No veering to one side is noted. No leaning to one side is noted. Posture is age-appropriate and stance is narrow based. Gait shows normal stride length and normal pace. No problems turning are noted.   Assessment and Plan:  In summary, Miranda Rollins is a 68 year old right-handed woman with an underlying medical history of hypertension, hyperlipidemia, hypothyroidism, history of kidney stone, mood disorder, palpitations, left knee pain and mild obesity, who presents for follow-up consultation of her obstructive sleep apnea on PAP therapy.  She is advised to get back on PAP therapy.  I wrote for new supplies.  She is encouraged to get in touch with her DME company regarding replacement appliance.  She is reminded not to use any Clorox or harsh chemicals otherwise to clean her supplies.  She is advised to follow-up routinely in this clinic in 1 year to see the nurse practitioner.  I answered all her questions today and she was in  agreement with our plan.  I spent 20 minutes in total face-to-face time and in reviewing records during pre-charting, more than 50% of which was spent in counseling and coordination of care, reviewing test results, reviewing medications and treatment regimen and/or in discussing or reviewing the diagnosis of OSA, the prognosis and treatment options. Pertinent laboratory and imaging test results that  were available during this visit with the patient were reviewed by me and considered in my medical decision making (see chart for details).

## 2024-07-18 NOTE — Patient Instructions (Signed)
 Please continue using your autoPAP regularly. While your insurance requires that you use PAP at least 4 hours each night on 70% of the nights, I recommend, that you not skip any nights and use it throughout the night if you can.  Untreated obstructive sleep apnea when it is moderate to severe can have an adverse impact on cardiovascular health and raise her risk for heart disease, arrhythmias, hypertension, congestive heart failure, stroke and diabetes. Untreated obstructive sleep apnea causes sleep disruption, nonrestorative sleep, and sleep deprivation. This can have an impact on your day to day functioning and cause daytime sleepiness and impairment of cognitive function, memory loss, mood disturbance, and problems focussing. Using CPAP regularly can improve these symptoms. I will write for new supplies and we will send the order to [facility].  We can see you in 1 year, you can see one of our nurse practitioners.

## 2024-07-23 NOTE — Progress Notes (Addendum)
 Community message has been sent to [facility] for mask and supplies on 07/23/24. DD

## 2024-08-08 ENCOUNTER — Other Ambulatory Visit: Payer: Self-pay | Admitting: Internal Medicine

## 2024-08-08 ENCOUNTER — Ambulatory Visit
Admission: RE | Admit: 2024-08-08 | Discharge: 2024-08-08 | Disposition: A | Source: Ambulatory Visit | Attending: Internal Medicine | Admitting: Internal Medicine

## 2024-08-08 DIAGNOSIS — K5791 Diverticulosis of intestine, part unspecified, without perforation or abscess with bleeding: Secondary | ICD-10-CM

## 2024-08-08 MED ORDER — IOPAMIDOL (ISOVUE-370) INJECTION 76%
70.0000 mL | Freq: Once | INTRAVENOUS | Status: AC | PRN
  Administered 2024-08-08: 70 mL via INTRAVENOUS

## 2024-08-09 ENCOUNTER — Ambulatory Visit (HOSPITAL_BASED_OUTPATIENT_CLINIC_OR_DEPARTMENT_OTHER): Admission: RE | Admit: 2024-08-09 | Source: Ambulatory Visit

## 2025-07-21 ENCOUNTER — Ambulatory Visit: Admitting: Neurology
